# Patient Record
Sex: Male | Born: 1960 | Race: White | Hispanic: No | Marital: Married | Smoking: Never smoker
Health system: Southern US, Community
[De-identification: ages and names within clinical notes are randomized; demographics above are authoritative.]

## PROBLEM LIST (undated history)

## (undated) DIAGNOSIS — Z87442 Personal history of urinary calculi: Secondary | ICD-10-CM

## (undated) DIAGNOSIS — K219 Gastro-esophageal reflux disease without esophagitis: Secondary | ICD-10-CM

## (undated) DIAGNOSIS — N201 Calculus of ureter: Secondary | ICD-10-CM

## (undated) DIAGNOSIS — N393 Stress incontinence (female) (male): Secondary | ICD-10-CM

## (undated) DIAGNOSIS — E785 Hyperlipidemia, unspecified: Secondary | ICD-10-CM

## (undated) DIAGNOSIS — N189 Chronic kidney disease, unspecified: Secondary | ICD-10-CM

## (undated) DIAGNOSIS — C61 Malignant neoplasm of prostate: Secondary | ICD-10-CM

## (undated) DIAGNOSIS — N529 Male erectile dysfunction, unspecified: Secondary | ICD-10-CM

## (undated) HISTORY — PX: WISDOM TOOTH EXTRACTION: SHX21

## (undated) HISTORY — PX: XI ROBOTIC ASSISTED SIMPLE PROSTATECTOMY: SHX6713

## (undated) HISTORY — PX: PROSTATE BIOPSY: SHX241

---

## 1988-02-29 HISTORY — PX: INGUINAL HERNIA REPAIR: SUR1180

## 2012-10-02 ENCOUNTER — Encounter (HOSPITAL_BASED_OUTPATIENT_CLINIC_OR_DEPARTMENT_OTHER): Payer: Self-pay | Admitting: *Deleted

## 2012-10-02 ENCOUNTER — Emergency Department (HOSPITAL_BASED_OUTPATIENT_CLINIC_OR_DEPARTMENT_OTHER): Payer: 59

## 2012-10-02 ENCOUNTER — Emergency Department (HOSPITAL_BASED_OUTPATIENT_CLINIC_OR_DEPARTMENT_OTHER)
Admission: EM | Admit: 2012-10-02 | Discharge: 2012-10-02 | Disposition: A | Discharge status: Home / Self Care | Payer: 59 | Attending: Emergency Medicine | Admitting: Emergency Medicine

## 2012-10-02 DIAGNOSIS — Z862 Personal history of diseases of the blood and blood-forming organs and certain disorders involving the immune mechanism: Secondary | ICD-10-CM | POA: Insufficient documentation

## 2012-10-02 DIAGNOSIS — Z8639 Personal history of other endocrine, nutritional and metabolic disease: Secondary | ICD-10-CM | POA: Insufficient documentation

## 2012-10-02 DIAGNOSIS — N201 Calculus of ureter: Secondary | ICD-10-CM | POA: Insufficient documentation

## 2012-10-02 DIAGNOSIS — K7689 Other specified diseases of liver: Secondary | ICD-10-CM | POA: Insufficient documentation

## 2012-10-02 DIAGNOSIS — K769 Liver disease, unspecified: Secondary | ICD-10-CM

## 2012-10-02 LAB — COMPREHENSIVE METABOLIC PANEL
ALT: 27 U/L (ref 0–53)
AST: 24 U/L (ref 0–37)
Albumin: 4.7 g/dL (ref 3.5–5.2)
CO2: 25 mEq/L (ref 19–32)
Calcium: 10.7 mg/dL — ABNORMAL HIGH (ref 8.4–10.5)
Chloride: 104 mEq/L (ref 96–112)
Creatinine, Ser: 1.6 mg/dL — ABNORMAL HIGH (ref 0.50–1.35)
GFR calc non Af Amer: 47 mL/min — ABNORMAL LOW (ref >90)
Sodium: 142 mEq/L (ref 135–145)
Total Bilirubin: 0.7 mg/dL (ref 0.3–1.2)

## 2012-10-02 LAB — URINALYSIS, ROUTINE W REFLEX MICROSCOPIC
Glucose, UA: NEGATIVE mg/dL
Leukocytes, UA: NEGATIVE
Nitrite: NEGATIVE
pH: 5.5 (ref 5.0–8.0)

## 2012-10-02 LAB — URINE MICROSCOPIC-ADD ON

## 2012-10-02 MED ORDER — HYDROMORPHONE HCL PF 1 MG/ML IJ SOLN
1.0000 mg | Freq: Once | INTRAMUSCULAR | Status: AC
  Administered 2012-10-02: 1 mg via INTRAVENOUS
  Filled 2012-10-02: qty 1

## 2012-10-02 MED ORDER — ONDANSETRON HCL 4 MG/2ML IJ SOLN
4.0000 mg | Freq: Once | INTRAMUSCULAR | Status: AC
  Administered 2012-10-02: 4 mg via INTRAVENOUS
  Filled 2012-10-02: qty 2

## 2012-10-02 MED ORDER — OXYCODONE-ACETAMINOPHEN 10-325 MG PO TABS: 1.0000 | ORAL_TABLET | ORAL | Status: DC | PRN

## 2012-10-02 MED ORDER — HYDROMORPHONE HCL PF 2 MG/ML IJ SOLN
2.0000 mg | Freq: Once | INTRAMUSCULAR | Status: AC
  Administered 2012-10-02: 2 mg via INTRAVENOUS
  Filled 2012-10-02: qty 1

## 2012-10-02 MED ORDER — HYDROMORPHONE HCL PF 1 MG/ML IJ SOLN
INTRAMUSCULAR | Status: AC
  Administered 2012-10-02: 1 mg via INTRAVENOUS
  Filled 2012-10-02: qty 1

## 2012-10-02 MED ORDER — ONDANSETRON 4 MG PO TBDP: 4.0000 mg | ORAL_TABLET | Freq: Three times a day (TID) | ORAL | Status: DC | PRN

## 2012-10-02 MED ORDER — KETOROLAC TROMETHAMINE 30 MG/ML IJ SOLN
30.0000 mg | Freq: Once | INTRAMUSCULAR | Status: AC
  Administered 2012-10-02: 30 mg via INTRAVENOUS
  Filled 2012-10-02: qty 1

## 2012-10-02 MED ORDER — HYDROMORPHONE HCL PF 1 MG/ML IJ SOLN: 1.0000 mg | Freq: Once | INTRAMUSCULAR | Status: AC

## 2012-10-02 NOTE — ED Provider Notes (Signed)
Medical screening examination/treatment/procedure(s) were performed by non-physician practitioner and as supervising physician I was immediately available for consultation/collaboration.  Lyanne Co, MD 10/02/12 [phone]

## 2012-10-02 NOTE — ED Notes (Signed)
Pt c/o pain again, NP made aware.

## 2012-10-02 NOTE — ED Provider Notes (Signed)
CSN: [id number]     Arrival date & time 10/02/12  1842 History     First MD Initiated Contact with Patient 10/02/12 1852     Chief Complaint  Patient presents with  . Flank Pain   (Consider location/radiation/quality/duration/timing/severity/associated sxs/prior Treatment) HPI Comments: Left flank pain times 3 days intermittently  Patient is a 52 y.o. male presenting with flank pain. The history is provided by the patient.  Flank Pain This is a new problem. The current episode started in the past 7 days. The problem has been waxing and waning. Associated symptoms include abdominal pain. Pertinent negatives include no fever. Nothing aggravates the symptoms. He has tried nothing for the symptoms.    Past Medical History  Diagnosis Date  . High triglycerides    Past Surgical History  Procedure Laterality Date  . Hernia repair     No family history on file. History  Substance Use Topics  . Smoking status: Never Smoker   . Smokeless tobacco: Not on file  . Alcohol Use: No    Review of Systems  Constitutional: Negative for fever.  Respiratory: Negative.   Cardiovascular: Negative.   Gastrointestinal: Positive for abdominal pain.  Genitourinary: Positive for flank pain.    Allergies  Review of patient's allergies indicates no known allergies.  Home Medications   Current Outpatient Rx  Name  Route  Sig  Dispense  Refill  . Fenofibrate (TRICOR PO)   Oral   Take by mouth.         . ondansetron (ZOFRAN ODT) 4 MG disintegrating tablet   Oral   Take 1 tablet (4 mg total) by mouth every 8 (eight) hours as needed for nausea.   20 tablet   0   . oxyCODONE-acetaminophen (PERCOCET) 10-325 MG per tablet   Oral   Take 1 tablet by mouth every 4 (four) hours as needed for pain.   30 tablet   0    BP 131/79  Pulse 72  Temp(Src) 97.8 F (36.6 C) (Oral)  Resp 18  Ht 5\' 9"  (1.753 m)  Wt 175 lb (79.379 kg)  BMI 25.83 kg/m2  SpO2 100% Physical Exam  Nursing note and  vitals reviewed. Constitutional: He is oriented to person, place, and time. He appears well-developed and well-nourished.  HENT:  Head: Normocephalic and atraumatic.  Eyes: Conjunctivae are normal. Pupils are equal, round, and reactive to light.  Neck: Normal range of motion. Neck supple.  Cardiovascular: Normal rate and regular rhythm.   Pulmonary/Chest: Effort normal and breath sounds normal.  Abdominal: Soft. Bowel sounds are normal. There is no tenderness.  Musculoskeletal: Normal range of motion.  Neurological: He is alert and oriented to person, place, and time.  Skin:  Yellow in color  Psychiatric: He has a normal mood and affect.    ED Course   Procedures (including critical care time)  Labs Reviewed  URINALYSIS, ROUTINE W REFLEX MICROSCOPIC - Abnormal; Notable for the following:    Color, Urine AMBER (*)    APPearance CLOUDY (*)    Hgb urine dipstick LARGE (*)    Bilirubin Urine SMALL (*)    Protein, ur 30 (*)    All other components within normal limits  COMPREHENSIVE METABOLIC PANEL - Abnormal; Notable for the following:    Glucose, Bld 135 (*)    Creatinine, Ser 1.60 (*)    Calcium 10.7 (*)    GFR calc non Af Amer 47 (*)    GFR calc Af Amer 55 (*)  All other components within normal limits  URINE MICROSCOPIC-ADD ON - Abnormal; Notable for the following:    Squamous Epithelial / LPF FEW (*)    All other components within normal limits   Ct Abdomen Pelvis Wo Contrast  10/02/2012   *RADIOLOGY REPORT*  Clinical Data:  Left-sided flank pain.  CT ABDOMEN AND PELVIS WITHOUT CONTRAST  Technique:  Multidetector CT imaging of the abdomen and pelvis was performed following the standard protocol without intravenous contrast.  Comparison:  No priors.  Findings:  Lung Bases: Scarring or subsegmental atelectasis in the medial segment of the right middle lobe.  Abdomen/Pelvis:  Image 50 of series 5 demonstrates an 8 mm stone in the proximal third of the left ureter at or  immediately beyond the left ureteropelvic junction.  There is moderate left-sided hydronephrosis indicative of obstruction at this time.  No additional calcifications are identified within the collecting system on either kidney, along the course of either ureter, or within the lumen of the urinary bladder.  The unenhanced appearance of the kidneys is otherwise unremarkable bilaterally.  In segment 5 of the liver there is an ill-defined 13 x 10 mm intermediate attenuation (50 HU) lesion which is incompletely characterized on today's noncontrast CT examination.  The appearance of the gallbladder, pancreas, spleen and bilateral adrenal glands is unremarkable.  Normal appendix.  No significant volume of ascites.  No pneumoperitoneum.  No pathologic distension of small bowel.  No definite pathologic lymphadenopathy identified within the abdomen or pelvis on today's noncontrast CT examination. Prostate gland and urinary bladder are unremarkable in appearance.  Musculoskeletal: There are no aggressive appearing lytic or blastic lesions noted in the visualized portions of the skeleton.  IMPRESSION: 1.  8 mm partially obstructing calculus in the proximal third of the left ureter at or immediately beyond the left ureteropelvic junction with moderate left-sided hydronephrosis at this time. 2.  Normal appendix. 3.  13 x 10 mm intermediate attenuation lesion in segment 5 of the liver is incompletely characterized on today's noncontrast CT examination.  Statistically, this is likely to represent a proteinaceous cyst or a hemangioma.  If this is of clinical concern, this could be definitively characterized with a nonemergent hepatic MRI with without IV Gadolinium if indicated.   Original Report Authenticated By: Trudie Reed, M.D.   1. Ureteral stone   2. Liver lesion     MDM  Pt is feeling better at this time:pt to follow up with urology:pt is okay to follow up with pcp for further evaluation of the liver  Teressa Lower, NP 10/02/12 2139

## 2012-10-02 NOTE — ED Notes (Signed)
Left flank pain on and off x 3 days.

## 2012-10-02 NOTE — ED Notes (Signed)
Patient transported to CT 

## 2012-10-04 ENCOUNTER — Other Ambulatory Visit: Payer: Self-pay | Admitting: Family Medicine

## 2012-10-04 DIAGNOSIS — K769 Liver disease, unspecified: Secondary | ICD-10-CM

## 2012-10-05 ENCOUNTER — Encounter (HOSPITAL_COMMUNITY): Payer: Self-pay | Admitting: Pharmacy Technician

## 2012-10-05 ENCOUNTER — Encounter (HOSPITAL_COMMUNITY): Payer: Self-pay | Admitting: General Practice

## 2012-10-05 ENCOUNTER — Other Ambulatory Visit: Payer: Self-pay | Admitting: Urology

## 2012-10-05 NOTE — H&P (Signed)
History of Present Illness   Mr. Kampf has been pain free since I saw him 2 days ago. He looked very good today. I repeated a KUB and the stone may have moved a few centimeters but it is still at L3 spinous process. He has mild stable frequency. He has no fever. Clinically, he is not infected.   Urinalysis: I reviewed, no bacteria. Urine culture from August 6 looked normal.   He would like to proceed with lithotripsy on Monday and I think it is a very good idea. He is a Company secretary.   Review of Systems: No change in bowel or neurologic systems.    Past Medical History Problems  1. History of  Hypercholesterolemia 272.0 2. History of  Hypercholesterolemia 272.0  Surgical History Problems  1. History of  Hernia Repair 2. History of  Hernia Repair  Current Meds 1. Fenofibrate TABS; Therapy: (Recorded:06Aug2014) to 2. Ondansetron HCl 4 MG Oral Tablet; Therapy: (Recorded:06Aug2014) to 3. OxyCODONE HCl TABS; Therapy: (Recorded:06Aug2014) to 4. Terbinafine HCl TABS; Therapy: (Recorded:06Aug2014) to  Allergies Medication  1. No Known Drug Allergies  Family History Problems  1. Family history of  Family Health Status Number Of Children 1 son and 1 daughter 2. Family history of  Heart Disease V17.49 3. Family history of  Hypertension V17.49 4. Paternal history of  Nephrolithiasis 5. Paternal history of  Pharyngeal Neoplasm 6. Paternal history of  Throat  Social History Problems  1. Caffeine Use 2. Marital History - Currently Married 3. 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 History of  Alcohol Use 5. History of  Tobacco Use 305.1  Results/Data Urine [Data Includes: Last 1 Day]   08Aug2014  COLOR YELLOW   APPEARANCE CLEAR   SPECIFIC GRAVITY 1.025   pH 6.0   GLUCOSE NEG mg/dL  BILIRUBIN NEG   KETONE 15 mg/dL  BLOOD LARGE   PROTEIN NEG mg/dL  UROBILINOGEN 0.2 mg/dL  NITRITE NEG   LEUKOCYTE ESTERASE NEG   SQUAMOUS EPITHELIAL/HPF NONE SEEN   WBC 0-2 WBC/hpf  RBC  21-50 RBC/hpf  BACTERIA NONE SEEN   CRYSTALS NONE SEEN   CASTS NONE SEEN    Plan Health Maintenance (V70.0)  1. UA With REFLEX  Done: 08Aug2014 09:24AM  Discussion/Summary   Lithotripsy booked for Monday. No further questions.  After a thorough review of the management options for the patient's condition the patient  elected to proceed with surgical therapy as noted above. We have discussed the potential benefits and risks of the procedure, side effects of the proposed treatment, the likelihood of the patient achieving the goals of the procedure, and any potential problems that might occur during the procedure or recuperation. Informed consent has been obtained.

## 2012-10-08 ENCOUNTER — Ambulatory Visit (HOSPITAL_COMMUNITY): Payer: 59

## 2012-10-08 ENCOUNTER — Encounter (HOSPITAL_COMMUNITY)
Admission: RE | Disposition: A | Discharge status: Home / Self Care | Payer: Self-pay | Source: Ambulatory Visit | Attending: Urology

## 2012-10-08 ENCOUNTER — Encounter (HOSPITAL_COMMUNITY): Payer: Self-pay | Admitting: General Practice

## 2012-10-08 ENCOUNTER — Ambulatory Visit (HOSPITAL_COMMUNITY)
Admission: RE | Admit: 2012-10-08 | Discharge: 2012-10-08 | Disposition: A | Discharge status: Home / Self Care | Payer: 59 | Source: Ambulatory Visit | Attending: Urology | Admitting: Urology

## 2012-10-08 DIAGNOSIS — N201 Calculus of ureter: Secondary | ICD-10-CM | POA: Insufficient documentation

## 2012-10-08 DIAGNOSIS — N1330 Unspecified hydronephrosis: Secondary | ICD-10-CM | POA: Insufficient documentation

## 2012-10-08 HISTORY — PX: EXTRACORPOREAL SHOCK WAVE LITHOTRIPSY: SHX1557

## 2012-10-08 SURGERY — LITHOTRIPSY, ESWL
Anesthesia: LOCAL | Laterality: Left

## 2012-10-08 MED ORDER — DIAZEPAM 5 MG PO TABS
10.0000 mg | ORAL_TABLET | ORAL | Status: AC
  Administered 2012-10-08: 10 mg via ORAL
  Filled 2012-10-08: qty 2

## 2012-10-08 MED ORDER — DEXTROSE-NACL 5-0.45 % IV SOLN
INTRAVENOUS | Status: DC
  Administered 2012-10-08: 15:00:00 via INTRAVENOUS

## 2012-10-08 MED ORDER — DIPHENHYDRAMINE HCL 25 MG PO CAPS
25.0000 mg | ORAL_CAPSULE | ORAL | Status: AC
  Administered 2012-10-08: 25 mg via ORAL
  Filled 2012-10-08: qty 1

## 2012-10-08 MED ORDER — CIPROFLOXACIN HCL 500 MG PO TABS
500.0000 mg | ORAL_TABLET | ORAL | Status: AC
  Administered 2012-10-08: 500 mg via ORAL
  Filled 2012-10-08: qty 1

## 2012-10-08 NOTE — Interval H&P Note (Signed)
History and Physical Interval Note:  10/08/2012 7:22 AM  Johnny Burns  has presented today for surgery, with the diagnosis of left ureteral stone   The various methods of treatment have been discussed with the patient and family. After consideration of risks, benefits and other options for treatment, the patient has consented to  Procedure(s): EXTRACORPOREAL SHOCK WAVE LITHOTRIPSY (ESWL) (Left) as a surgical intervention .  The patient's history has been reviewed, patient examined, no change in status, stable for surgery.  I have reviewed the patient's chart and labs.  Questions were answered to the patient's satisfaction.     Elya Diloreto A

## 2012-10-09 ENCOUNTER — Emergency Department (HOSPITAL_COMMUNITY): Payer: 59

## 2012-10-09 ENCOUNTER — Encounter (HOSPITAL_COMMUNITY): Payer: Self-pay

## 2012-10-09 ENCOUNTER — Emergency Department (HOSPITAL_COMMUNITY)
Admission: EM | Admit: 2012-10-09 | Discharge: 2012-10-09 | Disposition: A | Discharge status: Home / Self Care | Payer: 59 | Attending: Emergency Medicine | Admitting: Emergency Medicine

## 2012-10-09 DIAGNOSIS — Z8639 Personal history of other endocrine, nutritional and metabolic disease: Secondary | ICD-10-CM | POA: Insufficient documentation

## 2012-10-09 DIAGNOSIS — Z79899 Other long term (current) drug therapy: Secondary | ICD-10-CM | POA: Insufficient documentation

## 2012-10-09 DIAGNOSIS — Z792 Long term (current) use of antibiotics: Secondary | ICD-10-CM | POA: Insufficient documentation

## 2012-10-09 DIAGNOSIS — Z862 Personal history of diseases of the blood and blood-forming organs and certain disorders involving the immune mechanism: Secondary | ICD-10-CM | POA: Insufficient documentation

## 2012-10-09 DIAGNOSIS — M549 Dorsalgia, unspecified: Secondary | ICD-10-CM | POA: Insufficient documentation

## 2012-10-09 DIAGNOSIS — Z9889 Other specified postprocedural states: Secondary | ICD-10-CM | POA: Insufficient documentation

## 2012-10-09 DIAGNOSIS — R110 Nausea: Secondary | ICD-10-CM | POA: Insufficient documentation

## 2012-10-09 DIAGNOSIS — N200 Calculus of kidney: Secondary | ICD-10-CM | POA: Insufficient documentation

## 2012-10-09 LAB — URINALYSIS, ROUTINE W REFLEX MICROSCOPIC
Protein, ur: NEGATIVE mg/dL
Specific Gravity, Urine: 1.023 (ref 1.005–1.030)
Urobilinogen, UA: 0.2 mg/dL (ref 0.0–1.0)

## 2012-10-09 LAB — POCT I-STAT, CHEM 8
BUN: 9 mg/dL (ref 6–23)
Creatinine, Ser: 1.6 mg/dL — ABNORMAL HIGH (ref 0.50–1.35)
Hemoglobin: 16.3 g/dL (ref 13.0–17.0)
Potassium: 3.6 mEq/L (ref 3.5–5.1)
Sodium: 136 mEq/L (ref 135–145)
TCO2: 24 mmol/L (ref 0–100)

## 2012-10-09 LAB — URINE MICROSCOPIC-ADD ON

## 2012-10-09 MED ORDER — HYDROMORPHONE HCL PF 1 MG/ML IJ SOLN
1.0000 mg | Freq: Once | INTRAMUSCULAR | Status: AC
  Administered 2012-10-09: 1 mg via INTRAVENOUS
  Filled 2012-10-09: qty 1

## 2012-10-09 MED ORDER — ONDANSETRON HCL 4 MG/2ML IJ SOLN
4.0000 mg | Freq: Once | INTRAMUSCULAR | Status: AC
  Administered 2012-10-09: 4 mg via INTRAVENOUS
  Filled 2012-10-09: qty 2

## 2012-10-09 MED ORDER — SODIUM CHLORIDE 0.9 % IV SOLN
Freq: Once | INTRAVENOUS | Status: AC
  Administered 2012-10-09: 04:00:00 via INTRAVENOUS

## 2012-10-09 MED ORDER — HYDROMORPHONE HCL 4 MG PO TABS: 4.0000 mg | ORAL_TABLET | ORAL | Status: DC | PRN

## 2012-10-09 NOTE — ED Provider Notes (Signed)
CSN: [id number]     Arrival date & time 10/09/12  0346 History     First MD Initiated Contact with Patient 10/09/12 0402     Chief Complaint  Patient presents with  . Flank Pain   (Consider location/radiation/quality/duration/timing/severity/associated sxs/prior Treatment) HPI Comments: Patient presents with left flank pain. He has a history of 8 mm left ureteral stone and had a lithotripsy done yesterday by Dr. Jacquelyne Balint. He states initially the pain was doing okay however tonight he started having worsening pain and is taken several of his 10 mg oxycodone tablets without relief. He primarily has a constant aching pain in his left upper back. He currently denies any abdominal pain. He's had some nausea but no vomiting. He denies any fevers. He is able to urinate.  Patient is a 52 y.o. male presenting with flank pain.  Flank Pain Pertinent negatives include no chest pain, no abdominal pain, no headaches and no shortness of breath.    Past Medical History  Diagnosis Date  . High triglycerides    Past Surgical History  Procedure Laterality Date  . Hernia repair     History reviewed. No pertinent family history. History  Substance Use Topics  . Smoking status: Never Smoker   . Smokeless tobacco: Not on file  . Alcohol Use: No    Review of Systems  Constitutional: Negative for fever, chills, diaphoresis and fatigue.  HENT: Negative for congestion, rhinorrhea and sneezing.   Eyes: Negative.   Respiratory: Negative for cough, chest tightness and shortness of breath.   Cardiovascular: Negative for chest pain and leg swelling.  Gastrointestinal: Positive for nausea. Negative for vomiting, abdominal pain, diarrhea and blood in stool.  Genitourinary: Positive for flank pain. Negative for frequency, hematuria and difficulty urinating.  Musculoskeletal: Positive for back pain. Negative for arthralgias.  Skin: Negative for rash.  Neurological: Negative for dizziness, speech difficulty,  weakness, numbness and headaches.    Allergies  Review of patient's allergies indicates no known allergies.  Home Medications   Current Outpatient Rx  Name  Route  Sig  Dispense  Refill  . ciprofloxacin (CIPRO) 250 MG tablet   Oral   Take 250 mg by mouth 2 (two) times daily.         . fenofibrate 160 MG tablet   Oral   Take 160 mg by mouth every evening.         . ondansetron (ZOFRAN ODT) 4 MG disintegrating tablet   Oral   Take 1 tablet (4 mg total) by mouth every 8 (eight) hours as needed for nausea.   20 tablet   0   . oxyCODONE-acetaminophen (PERCOCET) 10-325 MG per tablet   Oral   Take 1 tablet by mouth every 4 (four) hours as needed for pain.   30 tablet   0   . silodosin (RAPAFLO) 8 MG CAPS capsule   Oral   Take 8 mg by mouth daily with breakfast.         . terbinafine (LAMISIL) 250 MG tablet   Oral   Take 250 mg by mouth daily.         Marland Kitchen HYDROmorphone (DILAUDID) 4 MG tablet   Oral   Take 1 tablet (4 mg total) by mouth every 4 (four) hours as needed for pain.   20 tablet   0    BP 148/84  Pulse 72  Temp(Src) 98.4 F (36.9 C) (Oral)  Resp 18  Ht 5\' 9"  (1.753 m)  Wt 172 lb (  78.019 kg)  BMI 25.39 kg/m2  SpO2 100% Physical Exam  Constitutional: He is oriented to person, place, and time. He appears well-developed and well-nourished.  HENT:  Head: Normocephalic and atraumatic.  Eyes: Pupils are equal, round, and reactive to light.  Neck: Normal range of motion. Neck supple.  Cardiovascular: Normal rate, regular rhythm and normal heart sounds.   Pulmonary/Chest: Effort normal and breath sounds normal. No respiratory distress. He has no wheezes. He has no rales. He exhibits no tenderness.  Abdominal: Soft. Bowel sounds are normal. There is no tenderness. There is no rebound and no guarding.  Left CVA tenderness  Musculoskeletal: Normal range of motion. He exhibits no edema.  Lymphadenopathy:    He has no cervical adenopathy.  Neurological: He  is alert and oriented to person, place, and time.  Skin: Skin is warm and dry. No rash noted.  Psychiatric: He has a normal mood and affect.    ED Course   Procedures (including critical care time)  Results for orders placed during the hospital encounter of 10/09/12  URINALYSIS, ROUTINE W REFLEX MICROSCOPIC      Result Value Range   Color, Urine AMBER (*) YELLOW   APPearance CLOUDY (*) CLEAR   Specific Gravity, Urine 1.023  1.005 - 1.030   pH 5.0  5.0 - 8.0   Glucose, UA NEGATIVE  NEGATIVE mg/dL   Hgb urine dipstick LARGE (*) NEGATIVE   Bilirubin Urine NEGATIVE  NEGATIVE   Ketones, ur NEGATIVE  NEGATIVE mg/dL   Protein, ur NEGATIVE  NEGATIVE mg/dL   Urobilinogen, UA 0.2  0.0 - 1.0 mg/dL   Nitrite NEGATIVE  NEGATIVE   Leukocytes, UA TRACE (*) NEGATIVE  URINE MICROSCOPIC-ADD ON      Result Value Range   Squamous Epithelial / LPF RARE  RARE   WBC, UA 0-2  <3 WBC/hpf   RBC / HPF 11-20  <3 RBC/hpf   Bacteria, UA MANY (*) RARE   Urine-Other MUCOUS PRESENT    POCT I-STAT, CHEM 8      Result Value Range   Sodium 136  135 - 145 mEq/L   Potassium 3.6  3.5 - 5.1 mEq/L   Chloride 101  96 - 112 mEq/L   BUN 9  6 - 23 mg/dL   Creatinine, Ser 8.29 (*) 0.50 - 1.35 mg/dL   Glucose, Bld 562 (*) 70 - 99 mg/dL   Calcium, Ion 1.30 (*) 1.12 - 1.23 mmol/L   TCO2 24  0 - 100 mmol/L   Hemoglobin 16.3  13.0 - 17.0 g/dL   HCT 86.5  78.4 - 69.6 %   Ct Abdomen Pelvis Wo Contrast  10/02/2012   *RADIOLOGY REPORT*  Clinical Data:  Left-sided flank pain.  CT ABDOMEN AND PELVIS WITHOUT CONTRAST  Technique:  Multidetector CT imaging of the abdomen and pelvis was performed following the standard protocol without intravenous contrast.  Comparison:  No priors.  Findings:  Lung Bases: Scarring or subsegmental atelectasis in the medial segment of the right middle lobe.  Abdomen/Pelvis:  Image 50 of series 5 demonstrates an 8 mm stone in the proximal third of the left ureter at or immediately beyond the left  ureteropelvic junction.  There is moderate left-sided hydronephrosis indicative of obstruction at this time.  No additional calcifications are identified within the collecting system on either kidney, along the course of either ureter, or within the lumen of the urinary bladder.  The unenhanced appearance of the kidneys is otherwise unremarkable bilaterally.  In segment  5 of the liver there is an ill-defined 13 x 10 mm intermediate attenuation (50 HU) lesion which is incompletely characterized on today's noncontrast CT examination.  The appearance of the gallbladder, pancreas, spleen and bilateral adrenal glands is unremarkable.  Normal appendix.  No significant volume of ascites.  No pneumoperitoneum.  No pathologic distension of small bowel.  No definite pathologic lymphadenopathy identified within the abdomen or pelvis on today's noncontrast CT examination. Prostate gland and urinary bladder are unremarkable in appearance.  Musculoskeletal: There are no aggressive appearing lytic or blastic lesions noted in the visualized portions of the skeleton.  IMPRESSION: 1.  8 mm partially obstructing calculus in the proximal third of the left ureter at or immediately beyond the left ureteropelvic junction with moderate left-sided hydronephrosis at this time. 2.  Normal appendix. 3.  13 x 10 mm intermediate attenuation lesion in segment 5 of the liver is incompletely characterized on today's noncontrast CT examination.  Statistically, this is likely to represent a proteinaceous cyst or a hemangioma.  If this is of clinical concern, this could be definitively characterized with a nonemergent hepatic MRI with without IV Gadolinium if indicated.   Original Report Authenticated By: Trudie Reed, M.D.   Dg Abd 1 View  10/09/2012   *RADIOLOGY REPORT*  Clinical Data: Flank pain.  ABDOMEN - 1 VIEW  Comparison: 10/08/2012.  Findings: The previously noted calculus projecting in the region of the left ureteropelvic junction  (UPJ) on the prior study is no longer identified in that region on today's examination.  However, there is now a 3 mm calcification in the left hemipelvis which appears to be new compared to prior study, and likely represents a stone fragment which has now migrated into the distal third of the left ureter. Gas and stool are seen scattered throughout the colon extending to the level of the distal rectum.  No pathologic distension of small bowel is noted.  No gross evidence of pneumoperitoneum.  Heterotopic ossification is noted medial to the left femoral neck (unchanged).  IMPRESSION: 1.  3 mm calcification projecting over the expected location of the distal third of the left ureter likely represents a fragment of the previously noted left UPJ stone.   Original Report Authenticated By: Trudie Reed, M.D.   Dg Abd 1 View  10/08/2012   *RADIOLOGY REPORT*  Clinical Data: Pre lithotripsy.  Left ureteral calculus.  ABDOMEN - 1 VIEW  Comparison: CT urogram 10/02/2012.  Findings: Left ureteral calculus is unchanged in position, just above the left L3 spinous process.  This calculus measures 6 mm in diameter.  No other suspicious calcifications are identified.  The bowel gas pattern is normal.  IMPRESSION: Unchanged position of proximal left ureteral calculus.   Original Report Authenticated By: Carey Bullocks, M.D.     1. Kidney stone     MDM  Patient presents with pain status post lithotripsy yesterday. A KUB shows a 3 mm fragment in the distal third of the ureter. His creatinine is stable. His hemoglobin is normal. His pain is fairly well controlled after Dilaudid. I discussed the case with Dr. Margarita Grizzle with [facility] who recommended switching the patient to oral dilaudid and having him followup in the office tomorrow if his symptoms are not controlled.  Rolan Bucco, MD 10/09/12 [phone]

## 2012-10-09 NOTE — ED Notes (Signed)
Pt had lithrotrypsy yesterday afternoon and was felling ok, this evening he started to have pain in the left flank again.

## 2012-10-22 ENCOUNTER — Ambulatory Visit
Admission: RE | Admit: 2012-10-22 | Discharge: 2012-10-22 | Disposition: A | Discharge status: Home / Self Care | Payer: 59 | Source: Ambulatory Visit | Attending: Family Medicine | Admitting: Family Medicine

## 2012-10-22 DIAGNOSIS — K769 Liver disease, unspecified: Secondary | ICD-10-CM

## 2012-10-22 MED ORDER — GADOBENATE DIMEGLUMINE 529 MG/ML IV SOLN
15.0000 mL | Freq: Once | INTRAVENOUS | Status: AC | PRN
  Administered 2012-10-22: 15 mL via INTRAVENOUS

## 2012-11-27 ENCOUNTER — Encounter (HOSPITAL_BASED_OUTPATIENT_CLINIC_OR_DEPARTMENT_OTHER): Payer: Self-pay | Admitting: *Deleted

## 2012-11-27 ENCOUNTER — Other Ambulatory Visit: Payer: Self-pay | Admitting: Urology

## 2012-11-28 ENCOUNTER — Encounter (HOSPITAL_BASED_OUTPATIENT_CLINIC_OR_DEPARTMENT_OTHER): Payer: Self-pay | Admitting: *Deleted

## 2012-11-28 NOTE — Progress Notes (Signed)
NPO AFTER MN. ARRIVE AT 1030. NEEDS ISTAT 8. MAY TAKE DILAUDID AM  OF SURG W/ SIPS OF WATER.IF NEEDED.

## 2012-11-30 ENCOUNTER — Encounter (HOSPITAL_BASED_OUTPATIENT_CLINIC_OR_DEPARTMENT_OTHER): Payer: Self-pay | Admitting: Anesthesiology

## 2012-11-30 ENCOUNTER — Ambulatory Visit (HOSPITAL_BASED_OUTPATIENT_CLINIC_OR_DEPARTMENT_OTHER)
Admission: RE | Admit: 2012-11-30 | Discharge: 2012-11-30 | Disposition: A | Discharge status: Home / Self Care | Payer: 59 | Source: Ambulatory Visit | Attending: Urology | Admitting: Urology

## 2012-11-30 ENCOUNTER — Encounter (HOSPITAL_BASED_OUTPATIENT_CLINIC_OR_DEPARTMENT_OTHER): Payer: Self-pay

## 2012-11-30 ENCOUNTER — Ambulatory Visit (HOSPITAL_COMMUNITY): Payer: 59

## 2012-11-30 ENCOUNTER — Encounter (HOSPITAL_BASED_OUTPATIENT_CLINIC_OR_DEPARTMENT_OTHER)
Admission: RE | Disposition: A | Discharge status: Home / Self Care | Payer: Self-pay | Source: Ambulatory Visit | Attending: Urology

## 2012-11-30 ENCOUNTER — Ambulatory Visit (HOSPITAL_BASED_OUTPATIENT_CLINIC_OR_DEPARTMENT_OTHER): Payer: 59 | Admitting: Anesthesiology

## 2012-11-30 DIAGNOSIS — N1330 Unspecified hydronephrosis: Secondary | ICD-10-CM | POA: Insufficient documentation

## 2012-11-30 DIAGNOSIS — N201 Calculus of ureter: Secondary | ICD-10-CM | POA: Insufficient documentation

## 2012-11-30 HISTORY — PX: CYSTOSCOPY WITH RETROGRADE PYELOGRAM, URETEROSCOPY AND STENT PLACEMENT: SHX5789

## 2012-11-30 HISTORY — DX: Calculus of ureter: N20.1

## 2012-11-30 LAB — POCT I-STAT, CHEM 8
Calcium, Ion: 1 mmol/L — ABNORMAL LOW (ref 1.12–1.23)
Chloride: 108 mEq/L (ref 96–112)
Creatinine, Ser: 1.3 mg/dL (ref 0.50–1.35)
Glucose, Bld: 91 mg/dL (ref 70–99)
HCT: 45 % (ref 39.0–52.0)
Hemoglobin: 15.3 g/dL (ref 13.0–17.0)
Potassium: 5.7 mEq/L — ABNORMAL HIGH (ref 3.5–5.1)
Sodium: 138 mEq/L (ref 135–145)
TCO2: 24 mmol/L (ref 0–100)

## 2012-11-30 SURGERY — CYSTOURETEROSCOPY, WITH RETROGRADE PYELOGRAM AND STENT INSERTION
Anesthesia: General | Site: Ureter | Laterality: Left | Wound class: Clean Contaminated

## 2012-11-30 MED ORDER — OXYCODONE HCL 5 MG/5ML PO SOLN
5.0000 mg | Freq: Once | ORAL | Status: DC | PRN
  Filled 2012-11-30: qty 5

## 2012-11-30 MED ORDER — OXYCODONE HCL 5 MG PO TABS
5.0000 mg | ORAL_TABLET | Freq: Once | ORAL | Status: DC | PRN
  Filled 2012-11-30: qty 1

## 2012-11-30 MED ORDER — SENNOSIDES-DOCUSATE SODIUM 8.6-50 MG PO TABS: 1.0000 | ORAL_TABLET | Freq: Two times a day (BID) | ORAL | Status: DC

## 2012-11-30 MED ORDER — PROMETHAZINE HCL 25 MG/ML IJ SOLN
6.2500 mg | INTRAMUSCULAR | Status: DC | PRN
  Filled 2012-11-30: qty 1

## 2012-11-30 MED ORDER — ONDANSETRON HCL 4 MG/2ML IJ SOLN
INTRAMUSCULAR | Status: DC | PRN
  Administered 2012-11-30: 4 mg via INTRAVENOUS

## 2012-11-30 MED ORDER — MIDAZOLAM HCL 5 MG/5ML IJ SOLN
INTRAMUSCULAR | Status: DC | PRN
  Administered 2012-11-30: 2 mg via INTRAVENOUS

## 2012-11-30 MED ORDER — PROPOFOL 10 MG/ML IV BOLUS
INTRAVENOUS | Status: DC | PRN
  Administered 2012-11-30: 170 mg via INTRAVENOUS

## 2012-11-30 MED ORDER — HYDROMORPHONE HCL PF 1 MG/ML IJ SOLN
0.2500 mg | INTRAMUSCULAR | Status: DC | PRN
  Filled 2012-11-30: qty 1

## 2012-11-30 MED ORDER — GENTAMICIN IN SALINE 1.6-0.9 MG/ML-% IV SOLN
80.0000 mg | INTRAVENOUS | Status: DC
  Filled 2012-11-30: qty 50

## 2012-11-30 MED ORDER — GENTAMICIN SULFATE 40 MG/ML IJ SOLN
5.0000 mg/kg | INTRAVENOUS | Status: DC
  Filled 2012-11-30: qty 9.5

## 2012-11-30 MED ORDER — MEPERIDINE HCL 25 MG/ML IJ SOLN
6.2500 mg | INTRAMUSCULAR | Status: DC | PRN
  Filled 2012-11-30: qty 1

## 2012-11-30 MED ORDER — OXYCODONE-ACETAMINOPHEN 5-325 MG PO TABS: 1.0000 | ORAL_TABLET | ORAL | Status: DC | PRN

## 2012-11-30 MED ORDER — LIDOCAINE HCL (CARDIAC) 20 MG/ML IV SOLN
INTRAVENOUS | Status: DC | PRN
  Administered 2012-11-30: 60 mg via INTRAVENOUS

## 2012-11-30 MED ORDER — SULFAMETHOXAZOLE-TMP DS 800-160 MG PO TABS: 1.0000 | ORAL_TABLET | Freq: Every day | ORAL | Status: DC

## 2012-11-30 MED ORDER — DEXAMETHASONE SODIUM PHOSPHATE 4 MG/ML IJ SOLN
INTRAMUSCULAR | Status: DC | PRN
  Administered 2012-11-30: 8 mg via INTRAVENOUS

## 2012-11-30 MED ORDER — SODIUM CHLORIDE 0.9 % IR SOLN
Status: DC | PRN
  Administered 2012-11-30: 6000 mL

## 2012-11-30 MED ORDER — LACTATED RINGERS IV SOLN
INTRAVENOUS | Status: DC
  Administered 2012-11-30 (×2): via INTRAVENOUS
  Filled 2012-11-30: qty 1000

## 2012-11-30 MED ORDER — KETOROLAC TROMETHAMINE 30 MG/ML IJ SOLN
INTRAMUSCULAR | Status: DC | PRN
  Administered 2012-11-30: 30 mg via INTRAVENOUS

## 2012-11-30 MED ORDER — IOHEXOL 350 MG/ML SOLN
INTRAVENOUS | Status: DC | PRN
  Administered 2012-11-30: 20 mL

## 2012-11-30 MED ORDER — FENTANYL CITRATE 0.05 MG/ML IJ SOLN
INTRAMUSCULAR | Status: DC | PRN
  Administered 2012-11-30 (×2): 25 ug via INTRAVENOUS
  Administered 2012-11-30: 50 ug via INTRAVENOUS

## 2012-11-30 SURGICAL SUPPLY — 38 items
BAG DRAIN URO-CYSTO SKYTR STRL (DRAIN) ×3 IMPLANT
BASKET LASER NITINOL 1.9FR (BASKET) ×3 IMPLANT
BASKET STNLS GEMINI 4WIRE 3FR (BASKET) IMPLANT
BASKET ZERO TIP NITINOL 2.4FR (BASKET) IMPLANT
BRUSH URET BIOPSY 3F (UROLOGICAL SUPPLIES) IMPLANT
CANISTER SUCT LVC 12 LTR MEDI- (MISCELLANEOUS) ×3 IMPLANT
CATH COUDE FOLEY 2W 5CC 18FR (CATHETERS) ×3 IMPLANT
CATH INTERMIT  6FR 70CM (CATHETERS) ×3 IMPLANT
CATH URET 5FR 28IN CONE TIP (BALLOONS)
CATH URET 5FR 28IN OPEN ENDED (CATHETERS) IMPLANT
CATH URET 5FR 70CM CONE TIP (BALLOONS) IMPLANT
CLOTH BEACON ORANGE TIMEOUT ST (SAFETY) ×3 IMPLANT
DRAPE CAMERA CLOSED 9X96 (DRAPES) ×3 IMPLANT
ELECT REM PT RETURN 9FT ADLT (ELECTROSURGICAL)
ELECTRODE REM PT RTRN 9FT ADLT (ELECTROSURGICAL) IMPLANT
GLOVE BIO SURGEON STRL SZ7.5 (GLOVE) ×3 IMPLANT
GLOVE BIOGEL M STER SZ 6 (GLOVE) ×3 IMPLANT
GLOVE BIOGEL PI IND STRL 6.5 (GLOVE) ×2 IMPLANT
GLOVE BIOGEL PI INDICATOR 6.5 (GLOVE) ×1
GOWN PREVENTION PLUS LG XLONG (DISPOSABLE) ×6 IMPLANT
GOWN STRL REIN XL XLG (GOWN DISPOSABLE) IMPLANT
GUIDEWIRE 0.038 PTFE COATED (WIRE) IMPLANT
GUIDEWIRE ANG ZIPWIRE 038X150 (WIRE) ×3 IMPLANT
GUIDEWIRE STR DUAL SENSOR (WIRE) ×3 IMPLANT
IV NS IRRIG 3000ML ARTHROMATIC (IV SOLUTION) ×9 IMPLANT
KIT BALLIN UROMAX 15FX10 (LABEL) IMPLANT
KIT BALLN UROMAX 15FX4 (MISCELLANEOUS) IMPLANT
KIT BALLN UROMAX 26 75X4 (MISCELLANEOUS)
LASER FIBER DISP (UROLOGICAL SUPPLIES) IMPLANT
PACK CYSTOSCOPY (CUSTOM PROCEDURE TRAY) ×3 IMPLANT
SET HIGH PRES BAL DIL (LABEL)
SHEATH ACCESS URETERAL 38CM (SHEATH) ×3 IMPLANT
SHEATH URET ACCESS 12FR/35CM (UROLOGICAL SUPPLIES) IMPLANT
SHEATH URET ACCESS 12FR/55CM (UROLOGICAL SUPPLIES) IMPLANT
STENT URET 6FRX26 CONTOUR (STENTS) ×3 IMPLANT
SYRINGE 10CC LL (SYRINGE) ×3 IMPLANT
SYRINGE IRR TOOMEY STRL 70CC (SYRINGE) IMPLANT
TUBE FEEDING 8FR 16IN STR KANG (MISCELLANEOUS) ×3 IMPLANT

## 2012-11-30 NOTE — Brief Op Note (Signed)
11/30/2012  12:21 PM  PATIENT:  Javier Glazier  52 y.o. male  PRE-OPERATIVE DIAGNOSIS:  LEFT URETERAL STONE  POST-OPERATIVE DIAGNOSIS:  LEFT URETERAL STONE  PROCEDURE:  Procedure(s): CYSTOSCOPY WITH LEFT RETROGRADE PYELOGRAM, LEFT URETEROSCOPY LEFT STONE EXTRACTION WITH BASKET  AND LEFT STENT PLACEMENT (Left)  SURGEON:  Surgeon(s) and Role:    * Sebastian Ache, MD - Primary  PHYSICIAN ASSISTANT:   ASSISTANTS: none   ANESTHESIA:   general  EBL:  Total I/O In: 200 [I.V.:200] Out: -   BLOOD ADMINISTERED:none  DRAINS: none   LOCAL MEDICATIONS USED:  NONE  SPECIMEN:  Source of Specimen:  Left Distal ureteral stone  DISPOSITION OF SPECIMEN:  [facility] for compositional analysis  COUNTS:  YES  TOURNIQUET:  * No tourniquets in log *  DICTATION: .Other Dictation: Dictation Number [id number]  PLAN OF CARE: Discharge to home after PACU  PATIENT DISPOSITION:  PACU - hemodynamically stable.   Delay start of Pharmacological VTE agent (>24hrs) due to surgical blood loss or risk of bleeding: not applicable

## 2012-11-30 NOTE — OR Nursing (Signed)
Left stone taking by Dr. Berneice Heinrich.

## 2012-11-30 NOTE — Anesthesia Preprocedure Evaluation (Addendum)
Anesthesia Evaluation  Patient identified by MRN, date of birth, ID band Patient awake    Reviewed: Allergy & Precautions, H&P , NPO status , Patient's Chart, lab work & pertinent test results  Airway Mallampati: II TM Distance: >3 FB Neck ROM: Full    Dental  (+) Teeth Intact and Dental Advisory Given   Pulmonary neg pulmonary ROS,  breath sounds clear to auscultation        Cardiovascular negative cardio ROS  Rhythm:Regular Rate:Normal     Neuro/Psych negative neurological ROS  negative psych ROS   GI/Hepatic negative GI ROS, Neg liver ROS,   Endo/Other  negative endocrine ROS  Renal/GU negative Renal ROS     Musculoskeletal negative musculoskeletal ROS (+)   Abdominal   Peds  Hematology negative hematology ROS (+)   Anesthesia Other Findings   Reproductive/Obstetrics                           Anesthesia Physical Anesthesia Plan  ASA: I  Anesthesia Plan: General   Post-op Pain Management:    Induction: Intravenous  Airway Management Planned: LMA  Additional Equipment:   Intra-op Plan:   Post-operative Plan: Extubation in OR  Informed Consent: I have reviewed the patients History and Physical, chart, labs and discussed the procedure including the risks, benefits and alternatives for the proposed anesthesia with the patient or authorized representative who has indicated his/her understanding and acceptance.   Dental advisory given  Plan Discussed with: CRNA  Anesthesia Plan Comments:         Anesthesia Quick Evaluation

## 2012-11-30 NOTE — Transfer of Care (Signed)
Immediate Anesthesia Transfer of Care Note  Patient: Johnny Burns  Procedure(s) Performed: Procedure(s) (LRB): CYSTOSCOPY WITH LEFT RETROGRADE PYELOGRAM, LEFT URETEROSCOPY LEFT STONE EXTRACTION WITH BASKET  AND LEFT STENT PLACEMENT (Left)  Patient Location: PACU  Anesthesia Type: General  Level of Consciousness: awake, oriented, sedated and patient cooperative  Airway & Oxygen Therapy: Patient Spontanous Breathing and Patient connected to face mask oxygen  Post-op Assessment: Report given to PACU RN and Post -op Vital signs reviewed and stable  Post vital signs: Reviewed and stable  Complications: No apparent anesthesia complications

## 2012-11-30 NOTE — Consult Note (Signed)
NAME:  Johnny Burns, Johnny Burns           ACCOUNT NO.:  [id number]  MEDICAL RECORD NO.:  [id number]  LOCATION:  WA10                         FACILITY:  [facility]  PHYSICIAN:  Treasa Bradshaw, MD     DATE OF BIRTH:  03/26/1960  DATE OF SURGERY: 11/30/2012  DIAGNOSIS:  Retained left distal ureteral stone.  PROCEDURE: 1. Cystoscopy with left retrograde pyelogram and interpretation. 2. Left ureteroscopy with basketing of stone. 3. Insertion of left ureteral stent 6 x 26 with tether to the dorsum     of the penis.  COMPLICATIONS:  None.  SPECIMEN:  Left ureteral stone for compositional analysis.  FINDINGS: 1. Unremarkable urinary bladder. 2. Distal filling defect on retrograde pyelogram with moderate     hydroureteronephrosis consistent with known stone. 3. Distal fragment on the left ureter consistent with known stone.  No     additional fragments above in the ureter or the kidney.  ESTIMATED BLOOD LOSS:  Nil.  DRAINS:  None.  INDICATION:  Johnny Burns is a pleasant 52-year-old gentleman with recent history of left renal colic from ureteral stone.  He underwent shockwave lithotripsy  and passed some stone fragments, however, he had residual mild flank pain and KUB imaging revealed a retained distal ureteral fragment.  He underwent a month of medical expulsive therapy with alpha blockers and pain control and still failed to pass the fragments.  Options were discussed including continued medical therapy versus repeat shockwave lithotripsy versus ureteroscopy and wished to proceed with the latter.  Informed consent was obtained and placed in medical record.  PROCEDURE IN DETAIL:  The patient being Johnny Burns, was verified. Procedure being left ureteroscopic stone manipulation was confirmed. Procedure was carried out.  Time-out was performed.  Intravenous antibiotics administered.  General LMA anesthesia was introduced.  The patient was placed into a low lithotomy position.  Sterile  field was created by prepping and draping the patient's penis, perineum, proximal thighs using iodine x3.  Next, cystourethroscopy was performed using a 22-French rigid scope with 12-degree offset lens.  Inspection of the anterior and posterior urethra unremarkable.  Inspection of the urinary bladder revealed no diverticula, calcifications, papular lesions.  The left ureteral orifice was then cannulated with a 6-French end-hole catheter and left retrograde pyelogram was obtained.  Left retrograde pyelogram demonstrated a single left ureter with single system left kidney.  There was a filling defect in distal ureter consistent with known stone.  There was moderate hydroureteronephrosis above this.  A 0.038 Glidewire was advanced at the level of the left upper pole and set aside as a safety wire.  Next, semi-rigid ureteroscopy was performed at the distal two-thirds of the left ureter alongside a separate Sensor working wire and with an 8-French feeding tube in urinary bladder for pressure release.  The fragment in question was indeed found in the distal left ureter.  This appeared to be amenable to simple basketing.  As such, a escape basket was used to grasp the stone and was removed in its entirety and set aside for compositional analysis as the goal for today was to verify stone free status.  The semi-rigid ureteroscope was exchanged for the flexible 6- French nondigital ureteroscope.  An attempt was made to pass this over the sensor working wire, however, it did not easily traverse the intramural ureter.  As such, a 12/14 38-cm   ureteral access sheath was placed over the working wire, which was easily navigated under continuous fluoroscopic vision to level of the proximal ureter.  Next, flexible ureteroscopy was performed of the proximal ureter and entire left kidney.  It was systematically inspected at the level of each calix and no additional stone fragments were found.  The entire  length of ureter was once again visualized using flexible ureteroscopy on withdrawal of the access sheath.  No mucosal abnormalities were found. Finally, a new 6 x 26 Polaris type stent was placed using cystoscopic and fluoroscopic guidance.  Good proximal and distal deployment were noted.  Tether was left in situ, fashioned the dorsum of the penis. Bladder was emptied per cystoscope.  Procedure was then terminated.  The patient tolerated the procedure well.  There were no immediate periprocedural complications.  The patient was taken to postanesthesia care unit in stable condition.          ______________________________ Johnny [name], MD     TM/MEDQ  D:  11/30/2012  T:  11/30/2012  Job:  [id number] 

## 2012-11-30 NOTE — H&P (Signed)
Johnny Burns is an 52 y.o. male.    Chief Complaint:  Pre-Op Left Ureteroscopic Stone Manipulation  HPI:   1 - Left Ureteral Stone - Pt s/p shockwave lithotripsy 10/08/12 for left 8mm ureteral stone (700HU, SSD 14cm) found on w/ ufor colickly left flank pain. No additional stones. No prior episdoes. Persited radiographically on f/u KUB likely as several left distal fragments and has not passed much material. Pain controlled, only occasional mild colic.  2 - Prostate Screening - no FHX prostate cancer 2014 - up to date per PCP with DRE and PSA per pateint  PMH sig for onchomycosis, right inguinal hernia repair  Today Johnny Burns is seen ito proceed with left ureteroscopic stone manipulation for his residual distal ureteral stone after lithotripsy. No interval fevers or stone passage.   Past Medical History  Diagnosis Date  . Left ureteral calculus     Past Surgical History  Procedure Laterality Date  . Extracorporeal shock wave lithotripsy Left 10-08-2012  . Inguinal hernia repair Right 1990    No family history on file. Social History:  reports that he has never smoked. He has never used smokeless tobacco. He reports that he does not drink alcohol or use illicit drugs.  Allergies: No Known Allergies  No prescriptions prior to admission    No results found for this or any previous visit (from the past 48 hour(s)). No results found.  Review of Systems  Constitutional: Negative.  Negative for fever and chills.  HENT: Negative.   Eyes: Negative.   Respiratory: Negative.   Cardiovascular: Negative.   Gastrointestinal: Negative.   Genitourinary: Negative.   Musculoskeletal: Negative.   Skin: Negative.   Neurological: Negative.   Endo/Heme/Allergies: Negative.   Psychiatric/Behavioral: Negative.     Height 5\' 9"  (1.753 m), weight 78.019 kg (172 lb). Physical Exam  Constitutional: He is oriented to person, place, and time. He appears well-developed and well-nourished.   HENT:  Head: Normocephalic and atraumatic.  Eyes: EOM are normal. Pupils are equal, round, and reactive to light.  Neck: Normal range of motion. Neck supple.  Cardiovascular: Normal rate.   Respiratory: Effort normal.  GI: Soft. Bowel sounds are normal.  Genitourinary: Penis normal.  Musculoskeletal: Normal range of motion.  Neurological: He is alert and oriented to person, place, and time.  Skin: Skin is warm and dry.  Psychiatric: He has a normal mood and affect. His behavior is normal. Judgment and thought content normal.     Assessment/Plan  1 - Left Ureteral Stone -  We rediscussed ureteroscopic stone manipulation with basketing and laser-lithotripsy in detail.  We ediscussed risks including bleeding, infection, damage to kidney / ureter  bladder, rarely loss of kidney. We rediscussed anesthetic risks and rare but serious surgical complications including DVT, PE, MI, and mortality. We specifically readdressed that in 5-10% of cases a staged approach is required with stenting followed by re-attempt ureteroscopy if anatomy unfavorable.   The patient voiced understanding and wises to proceed today as planned.   2 - Prostate Screening - up to date this year  [facility], Chimene Salo 11/30/2012, 6:29 AM

## 2012-11-30 NOTE — Anesthesia Postprocedure Evaluation (Signed)
Anesthesia Post Note  Patient: Johnny Burns  Procedure(s) Performed: Procedure(s) (LRB): CYSTOSCOPY WITH LEFT RETROGRADE PYELOGRAM, LEFT URETEROSCOPY LEFT STONE EXTRACTION WITH BASKET  AND LEFT STENT PLACEMENT (Left)  Anesthesia type: General  Patient location: PACU  Post pain: Pain level controlled  Post assessment: Post-op Vital signs reviewed  Last Vitals: BP 108/70  Pulse 73  Temp(Src) 37.1 C (Oral)  Resp 14  Ht 5\' 9"  (1.753 m)  Wt 167 lb (75.751 kg)  BMI 24.65 kg/m2  SpO2 97%  Post vital signs: Reviewed  Level of consciousness: sedated  Complications: No apparent anesthesia complications

## 2012-11-30 NOTE — Anesthesia Procedure Notes (Signed)
Procedure Name: LMA Insertion Date/Time: 11/30/2012 11:47 AM Performed by: Renella Cunas D Pre-anesthesia Checklist: Patient identified, Emergency Drugs available, Suction available and Patient being monitored Patient Re-evaluated:Patient Re-evaluated prior to inductionOxygen Delivery Method: Circle System Utilized Preoxygenation: Pre-oxygenation with 100% oxygen Intubation Type: IV induction Ventilation: Mask ventilation without difficulty LMA: LMA inserted LMA Size: 4.0 Number of attempts: 1 Airway Equipment and Method: bite block Placement Confirmation: positive ETCO2 Tube secured with: Tape Dental Injury: Teeth and Oropharynx as per pre-operative assessment

## 2012-11-30 NOTE — Consult Note (Deleted)
NAMEMarland Kitchen  Johnny Burns, Johnny Burns NO.:  [id number]  MEDICAL RECORD NO.:  [id number]  LOCATION:  WA10                         FACILITY:  [facility]  PHYSICIAN:  Sebastian Ache, MD     DATE OF BIRTH:  [birth year]  DATE OF SURGERY: 11/30/2012  DIAGNOSIS:  Retained left distal ureteral stone.  PROCEDURE: 1. Cystoscopy with left retrograde pyelogram and interpretation. 2. Left ureteroscopy with basketing of stone. 3. Insertion of left ureteral stent 6 x 26 with tether to the dorsum     of the penis.  COMPLICATIONS:  None.  SPECIMEN:  Left ureteral stone for compositional analysis.  FINDINGS: 1. Unremarkable urinary bladder. 2. Distal filling defect on retrograde pyelogram with moderate     hydroureteronephrosis consistent with known stone. 3. Distal fragment on the left ureter consistent with known stone.  No     additional fragments above in the ureter or the kidney.  ESTIMATED BLOOD LOSS:  Nil.  DRAINS:  None.  INDICATION:  Johnny Burns is a pleasant 52 year old gentleman with recent history of left renal colic from ureteral stone.  He underwent shockwave lithotripsy  and passed some stone fragments, however, he had residual mild flank pain and KUB imaging revealed a retained distal ureteral fragment.  He underwent a month of medical expulsive therapy with alpha blockers and pain control and still failed to pass the fragments.  Options were discussed including continued medical therapy versus repeat shockwave lithotripsy versus ureteroscopy and wished to proceed with the latter.  Informed consent was obtained and placed in medical record.  PROCEDURE IN DETAIL:  The patient being Johnny Burns, was verified. Procedure being left ureteroscopic stone manipulation was confirmed. Procedure was carried out.  Time-out was performed.  Intravenous antibiotics administered.  General LMA anesthesia was introduced.  The patient was placed into a low lithotomy position.  Sterile  field was created by prepping and draping the patient's penis, perineum, proximal thighs using iodine x3.  Next, cystourethroscopy was performed using a 22-French rigid scope with 12-degree offset lens.  Inspection of the anterior and posterior urethra unremarkable.  Inspection of the urinary bladder revealed no diverticula, calcifications, papular lesions.  The left ureteral orifice was then cannulated with a 6-French end-hole catheter and left retrograde pyelogram was obtained.  Left retrograde pyelogram demonstrated a single left ureter with single system left kidney.  There was a filling defect in distal ureter consistent with known stone.  There was moderate hydroureteronephrosis above this.  A 0.038 Glidewire was advanced at the level of the left upper pole and set aside as a safety wire.  Next, semi-rigid ureteroscopy was performed at the distal two-thirds of the left ureter alongside a separate Sensor working wire and with an 8-French feeding tube in urinary bladder for pressure release.  The fragment in question was indeed found in the distal left ureter.  This appeared to be amenable to simple basketing.  As such, a escape basket was used to grasp the stone and was removed in its entirety and set aside for compositional analysis as the goal for today was to verify stone free status.  The semi-rigid ureteroscope was exchanged for the flexible 6- French nondigital ureteroscope.  An attempt was made to pass this over the sensor working wire, however, it did not easily traverse the intramural ureter.  As such, a 12/14 38-cm  ureteral access sheath was placed over the working wire, which was easily navigated under continuous fluoroscopic vision to level of the proximal ureter.  Next, flexible ureteroscopy was performed of the proximal ureter and entire left kidney.  It was systematically inspected at the level of each calix and no additional stone fragments were found.  The entire  length of ureter was once again visualized using flexible ureteroscopy on withdrawal of the access sheath.  No mucosal abnormalities were found. Finally, a new 6 x 26 Polaris type stent was placed using cystoscopic and fluoroscopic guidance.  Good proximal and distal deployment were noted.  Tether was left in situ, fashioned the dorsum of the penis. Bladder was emptied per cystoscope.  Procedure was then terminated.  The patient tolerated the procedure well.  There were no immediate periprocedural complications.  The patient was taken to postanesthesia care unit in stable condition.          ______________________________ Sebastian Ache, MD     TM/MEDQ  D:  11/30/2012  T:  11/30/2012  Job:  [id number]

## 2012-12-03 ENCOUNTER — Encounter (HOSPITAL_BASED_OUTPATIENT_CLINIC_OR_DEPARTMENT_OTHER): Payer: Self-pay | Admitting: Urology

## 2014-10-22 ENCOUNTER — Ambulatory Visit
Admission: RE | Admit: 2014-10-22 | Discharge: 2014-10-22 | Disposition: A | Discharge status: Home / Self Care | Payer: No Typology Code available for payment source | Source: Ambulatory Visit | Attending: Occupational Medicine | Admitting: Occupational Medicine

## 2014-10-22 ENCOUNTER — Other Ambulatory Visit: Payer: Self-pay | Admitting: Occupational Medicine

## 2014-10-22 DIAGNOSIS — Z139 Encounter for screening, unspecified: Secondary | ICD-10-CM

## 2016-05-09 DIAGNOSIS — E785 Hyperlipidemia, unspecified: Secondary | ICD-10-CM | POA: Diagnosis not present

## 2016-05-09 DIAGNOSIS — Z0000 Encounter for general adult medical examination without abnormal findings: Secondary | ICD-10-CM | POA: Diagnosis not present

## 2016-07-28 DIAGNOSIS — R972 Elevated prostate specific antigen [PSA]: Secondary | ICD-10-CM | POA: Diagnosis not present

## 2016-07-28 DIAGNOSIS — N200 Calculus of kidney: Secondary | ICD-10-CM | POA: Diagnosis not present

## 2016-07-28 DIAGNOSIS — N183 Chronic kidney disease, stage 3 (moderate): Secondary | ICD-10-CM | POA: Diagnosis not present

## 2016-10-06 DIAGNOSIS — R972 Elevated prostate specific antigen [PSA]: Secondary | ICD-10-CM | POA: Diagnosis not present

## 2016-10-10 ENCOUNTER — Other Ambulatory Visit: Payer: Self-pay | Admitting: Urology

## 2016-10-10 DIAGNOSIS — C61 Malignant neoplasm of prostate: Secondary | ICD-10-CM

## 2016-10-14 DIAGNOSIS — C61 Malignant neoplasm of prostate: Secondary | ICD-10-CM | POA: Diagnosis not present

## 2016-10-18 ENCOUNTER — Ambulatory Visit (HOSPITAL_COMMUNITY)
Admission: RE | Admit: 2016-10-18 | Discharge: 2016-10-18 | Disposition: A | Discharge status: Home / Self Care | Payer: 59 | Source: Ambulatory Visit | Attending: Urology | Admitting: Urology

## 2016-10-18 ENCOUNTER — Encounter (HOSPITAL_COMMUNITY)
Admission: RE | Admit: 2016-10-18 | Discharge: 2016-10-18 | Disposition: A | Discharge status: Home / Self Care | Payer: 59 | Source: Ambulatory Visit | Attending: Urology | Admitting: Urology

## 2016-10-18 DIAGNOSIS — C61 Malignant neoplasm of prostate: Secondary | ICD-10-CM

## 2016-10-18 DIAGNOSIS — K8020 Calculus of gallbladder without cholecystitis without obstruction: Secondary | ICD-10-CM | POA: Diagnosis not present

## 2016-10-18 MED ORDER — TECHNETIUM TC 99M MEDRONATE IV KIT
20.9000 | PACK | Freq: Once | INTRAVENOUS | Status: AC | PRN
  Administered 2016-10-18: 20.9 via INTRAVENOUS

## 2016-10-24 DIAGNOSIS — C61 Malignant neoplasm of prostate: Secondary | ICD-10-CM | POA: Diagnosis not present

## 2016-10-24 DIAGNOSIS — N200 Calculus of kidney: Secondary | ICD-10-CM | POA: Diagnosis not present

## 2016-10-25 ENCOUNTER — Other Ambulatory Visit: Payer: Self-pay | Admitting: Urology

## 2016-11-03 DIAGNOSIS — M6281 Muscle weakness (generalized): Secondary | ICD-10-CM | POA: Diagnosis not present

## 2016-11-03 DIAGNOSIS — R278 Other lack of coordination: Secondary | ICD-10-CM | POA: Diagnosis not present

## 2016-11-03 DIAGNOSIS — C61 Malignant neoplasm of prostate: Secondary | ICD-10-CM | POA: Diagnosis not present

## 2016-11-15 DIAGNOSIS — M6281 Muscle weakness (generalized): Secondary | ICD-10-CM | POA: Diagnosis not present

## 2016-11-15 DIAGNOSIS — M62838 Other muscle spasm: Secondary | ICD-10-CM | POA: Diagnosis not present

## 2016-11-18 ENCOUNTER — Encounter (HOSPITAL_COMMUNITY): Payer: Self-pay

## 2016-11-18 NOTE — Patient Instructions (Addendum)
Johnny Burns  11/18/2016   Your procedure is scheduled on: 11/30/16  Report to [facility] Main  Entrance Take [place]  elevators to 3rd floor to  [place] at    1100AM.     Call this number if you have problems the morning of surgery [phone]    Remember: ONLY 1 PERSON MAY GO WITH YOU TO SHORT STAY TO GET  READY MORNING OF YOUR SURGERY.             FOLLOW A CLEAR LIQUID DIET THE DAY BEFORE SURGERY AND ONE BOTTLE OF MAGNESIUM CIT[place] BY NOON THE DAY BEFORE SURGERY   Do not  drink liquids : after . 0700 am the day of your surgery     Take these medicines the morning of surgery with A SIP OF WATER: NONE                                You may not have any metal on your body including hair pins and              piercings  Do not wear jewelry,lotions, powders or perfumes, deodorant                      Men may shave face and neck.   Do not bring valuables to the hospital. [place].  Contacts, dentures or bridgework may not be worn into surgery.  Leave suitcase in the car. After surgery it may be brought to your room.               Please read over the following fact sheets you were given: _____________________________________________________________________          [facility] - Preparing for Surgery Before surgery, you can play an important role.  Because skin is not sterile, your skin needs to be as free of germs as possible.  You can reduce the number of germs on your skin by washing with CHG (chlorahexidine gluconate) soap before surgery.  CHG is an antiseptic cleaner which kills germs and bonds with the skin to continue killing germs even after washing. Please DO NOT use if you have an allergy to CHG or antibacterial soaps.  If your skin becomes reddened/irritated stop using the CHG and inform your nurse when you arrive at Short Stay. Do not shave (including legs and underarms) for at least  48 hours prior to the first CHG shower.  You may shave your face/neck. Please follow these instructions carefully:  1.  Shower with CHG Soap the night before surgery and the  morning of Surgery.  2.  If you choose to wash your hair, wash your hair first as usual with your  normal  shampoo.  3.  After you shampoo, rinse your hair and body thoroughly to remove the  shampoo.                           4.  Use CHG as you would any other liquid soap.  You can apply chg directly  to the skin and wash  Gently with a scrungie or clean washcloth.  5.  Apply the CHG Soap to your body ONLY FROM THE NECK DOWN.   Do not use on face/ open                           Wound or open sores. Avoid contact with eyes, ears mouth and genitals (private parts).                       Wash face,  Genitals (private parts) with your normal soap.             6.  Wash thoroughly, paying special attention to the area where your surgery  will be performed.  7.  Thoroughly rinse your body with warm water from the neck down.  8.  DO NOT shower/wash with your normal soap after using and rinsing off  the CHG Soap.                9.  Pat yourself dry with a clean towel.            10.  Wear clean pajamas.            11.  Place clean sheets on your bed the night of your first shower and do not  sleep with pets. Day of Surgery : Do not apply any lotions/deodorants the morning of surgery.  Please wear clean clothes to the hospital/surgery center.  FAILURE TO FOLLOW THESE INSTRUCTIONS MAY RESULT IN THE CANCELLATION OF YOUR SURGERY PATIENT SIGNATURE_________________________________  NURSE SIGNATURE__________________________________  ________________________________________________________________________    CLEAR LIQUID DIET  THE DAY BEFORE SURGERY   Foods Allowed                                                                     Foods Excluded  Coffee and tea, regular and decaf                              liquids that you cannot  Plain Jell-O in any flavor                                             see through such as: Fruit ices (not with fruit pulp)                                     milk, soups, orange juice  Iced Popsicles                                    All solid food Carbonated beverages, regular and diet                                    Cranberry, grape and apple juices Sports drinks like Gatorade Lightly seasoned clear broth or consume(fat  free) Sugar, honey syrup  Sample Menu Breakfast                                Lunch                                     Supper Cranberry juice                    Beef broth                            Chicken broth Jell-O                                     Grape juice                           Apple juice Coffee or tea                        Jell-O                                      Popsicle                                                Coffee or tea                        Coffee or tea  _____________________________________________________________________

## 2016-11-21 ENCOUNTER — Encounter (HOSPITAL_COMMUNITY)
Admission: RE | Admit: 2016-11-21 | Discharge: 2016-11-21 | Disposition: A | Discharge status: Home / Self Care | Payer: 59 | Source: Ambulatory Visit | Attending: Urology | Admitting: Urology

## 2016-11-21 ENCOUNTER — Encounter (HOSPITAL_COMMUNITY): Payer: Self-pay

## 2016-11-21 DIAGNOSIS — Z01812 Encounter for preprocedural laboratory examination: Secondary | ICD-10-CM | POA: Diagnosis present

## 2016-11-21 DIAGNOSIS — C61 Malignant neoplasm of prostate: Secondary | ICD-10-CM | POA: Insufficient documentation

## 2016-11-21 DIAGNOSIS — M62838 Other muscle spasm: Secondary | ICD-10-CM | POA: Diagnosis not present

## 2016-11-21 DIAGNOSIS — M6281 Muscle weakness (generalized): Secondary | ICD-10-CM | POA: Diagnosis not present

## 2016-11-21 HISTORY — DX: Personal history of urinary calculi: Z87.442

## 2016-11-21 HISTORY — DX: Hyperlipidemia, unspecified: E78.5

## 2016-11-21 HISTORY — DX: Chronic kidney disease, unspecified: N18.9

## 2016-11-21 LAB — BASIC METABOLIC PANEL
ANION GAP: 6 (ref 5–15)
BUN: 15 mg/dL (ref 6–20)
CHLORIDE: 108 mmol/L (ref 101–111)
CO2: 26 mmol/L (ref 22–32)
Calcium: 9 mg/dL (ref 8.9–10.3)
Creatinine, Ser: 1.25 mg/dL — ABNORMAL HIGH (ref 0.61–1.24)
GFR calc Af Amer: >60 mL/min (ref >60)
GLUCOSE: 102 mg/dL — AB (ref 65–99)
POTASSIUM: 4.7 mmol/L (ref 3.5–5.1)
Sodium: 140 mmol/L (ref 135–145)

## 2016-11-21 LAB — CBC
HCT: 43.9 % (ref 39.0–52.0)
HEMOGLOBIN: 15.2 g/dL (ref 13.0–17.0)
MCH: 31.3 pg (ref 26.0–34.0)
MCHC: 34.6 g/dL (ref 30.0–36.0)
MCV: 90.3 fL (ref 78.0–100.0)
Platelets: 248 10*3/uL (ref 150–400)
RBC: 4.86 MIL/uL (ref 4.22–5.81)
RDW: 12.9 % (ref 11.5–15.5)
WBC: 6.3 10*3/uL (ref 4.0–10.5)

## 2016-11-30 ENCOUNTER — Encounter (HOSPITAL_COMMUNITY): Payer: Self-pay | Admitting: Anesthesiology

## 2016-11-30 ENCOUNTER — Observation Stay (HOSPITAL_COMMUNITY)
Admission: RE | Admit: 2016-11-30 | Discharge: 2016-12-01 | Disposition: A | Discharge status: Home / Self Care | Payer: 59 | Source: Ambulatory Visit | Attending: Urology | Admitting: Urology

## 2016-11-30 ENCOUNTER — Encounter (HOSPITAL_COMMUNITY)
Admission: RE | Disposition: A | Discharge status: Home / Self Care | Payer: Self-pay | Source: Ambulatory Visit | Attending: Urology

## 2016-11-30 ENCOUNTER — Ambulatory Visit (HOSPITAL_COMMUNITY): Payer: 59 | Admitting: Anesthesiology

## 2016-11-30 DIAGNOSIS — C61 Malignant neoplasm of prostate: Principal | ICD-10-CM | POA: Diagnosis present

## 2016-11-30 DIAGNOSIS — Z87442 Personal history of urinary calculi: Secondary | ICD-10-CM | POA: Insufficient documentation

## 2016-11-30 DIAGNOSIS — Z23 Encounter for immunization: Secondary | ICD-10-CM | POA: Diagnosis not present

## 2016-11-30 DIAGNOSIS — N183 Chronic kidney disease, stage 3 (moderate): Secondary | ICD-10-CM | POA: Insufficient documentation

## 2016-11-30 DIAGNOSIS — Z79899 Other long term (current) drug therapy: Secondary | ICD-10-CM | POA: Diagnosis not present

## 2016-11-30 DIAGNOSIS — E785 Hyperlipidemia, unspecified: Secondary | ICD-10-CM | POA: Diagnosis not present

## 2016-11-30 HISTORY — PX: LYMPHADENECTOMY: SHX5960

## 2016-11-30 HISTORY — PX: ROBOT ASSISTED LAPAROSCOPIC RADICAL PROSTATECTOMY: SHX5141

## 2016-11-30 LAB — HEMOGLOBIN AND HEMATOCRIT, BLOOD
HEMATOCRIT: 39.6 % (ref 39.0–52.0)
Hemoglobin: 13.8 g/dL (ref 13.0–17.0)

## 2016-11-30 SURGERY — PROSTATECTOMY, RADICAL, ROBOT-ASSISTED, LAPAROSCOPIC
Anesthesia: General

## 2016-11-30 MED ORDER — SUFENTANIL CITRATE 50 MCG/ML IV SOLN
INTRAVENOUS | Status: AC
  Filled 2016-11-30: qty 1

## 2016-11-30 MED ORDER — LACTATED RINGERS IR SOLN
Status: DC | PRN
  Administered 2016-11-30: 1000 mL

## 2016-11-30 MED ORDER — PROPOFOL 10 MG/ML IV BOLUS
INTRAVENOUS | Status: DC | PRN
  Administered 2016-11-30: 160 mg via INTRAVENOUS

## 2016-11-30 MED ORDER — HYDROCODONE-ACETAMINOPHEN 5-325 MG PO TABS: 1.0000 | ORAL_TABLET | Freq: Four times a day (QID) | ORAL | 0 refills | Status: DC | PRN

## 2016-11-30 MED ORDER — DEXTROSE-NACL 5-0.45 % IV SOLN
INTRAVENOUS | Status: DC
  Administered 2016-11-30: 20:00:00 via INTRAVENOUS

## 2016-11-30 MED ORDER — SUFENTANIL CITRATE 50 MCG/ML IV SOLN
INTRAVENOUS | Status: DC | PRN
  Administered 2016-11-30 (×2): 10 ug via INTRAVENOUS
  Administered 2016-11-30: 20 ug via INTRAVENOUS
  Administered 2016-11-30: 10 ug via INTRAVENOUS

## 2016-11-30 MED ORDER — SODIUM CHLORIDE 0.9 % IJ SOLN
INTRAMUSCULAR | Status: DC | PRN
  Administered 2016-11-30: 20 mL

## 2016-11-30 MED ORDER — HYDROMORPHONE HCL 1 MG/ML IJ SOLN
INTRAMUSCULAR | Status: DC | PRN
  Administered 2016-11-30 (×5): .4 mg via INTRAVENOUS

## 2016-11-30 MED ORDER — MIDAZOLAM HCL 2 MG/2ML IJ SOLN
INTRAMUSCULAR | Status: DC | PRN
  Administered 2016-11-30: 2 mg via INTRAVENOUS

## 2016-11-30 MED ORDER — HYDROMORPHONE HCL 2 MG/ML IJ SOLN
INTRAMUSCULAR | Status: AC
  Filled 2016-11-30: qty 1

## 2016-11-30 MED ORDER — ROCURONIUM BROMIDE 50 MG/5ML IV SOSY
PREFILLED_SYRINGE | INTRAVENOUS | Status: AC
  Filled 2016-11-30: qty 5

## 2016-11-30 MED ORDER — MIDAZOLAM HCL 2 MG/2ML IJ SOLN
INTRAMUSCULAR | Status: AC
  Filled 2016-11-30: qty 2

## 2016-11-30 MED ORDER — SODIUM CHLORIDE 0.9 % IJ SOLN
INTRAMUSCULAR | Status: AC
  Filled 2016-11-30: qty 10

## 2016-11-30 MED ORDER — SULFAMETHOXAZOLE-TRIMETHOPRIM 800-160 MG PO TABS: 1.0000 | ORAL_TABLET | Freq: Two times a day (BID) | ORAL | 0 refills | Status: DC

## 2016-11-30 MED ORDER — SODIUM CHLORIDE 0.9 % IJ SOLN
INTRAMUSCULAR | Status: AC
  Filled 2016-11-30: qty 20

## 2016-11-30 MED ORDER — ONDANSETRON HCL 4 MG/2ML IJ SOLN
INTRAMUSCULAR | Status: AC
  Filled 2016-11-30: qty 2

## 2016-11-30 MED ORDER — PROMETHAZINE HCL 25 MG/ML IJ SOLN: 6.2500 mg | INTRAMUSCULAR | Status: DC | PRN

## 2016-11-30 MED ORDER — FENOFIBRATE 160 MG PO TABS
160.0000 mg | ORAL_TABLET | Freq: Every evening | ORAL | Status: DC
  Filled 2016-11-30: qty 1

## 2016-11-30 MED ORDER — SUGAMMADEX SODIUM 200 MG/2ML IV SOLN
INTRAVENOUS | Status: DC | PRN
  Administered 2016-11-30: 170 mg via INTRAVENOUS

## 2016-11-30 MED ORDER — LIDOCAINE 2% (20 MG/ML) 5 ML SYRINGE
INTRAMUSCULAR | Status: AC
  Filled 2016-11-30: qty 5

## 2016-11-30 MED ORDER — DIPHENHYDRAMINE HCL 50 MG/ML IJ SOLN: 12.5000 mg | Freq: Four times a day (QID) | INTRAMUSCULAR | Status: DC | PRN

## 2016-11-30 MED ORDER — DEXAMETHASONE SODIUM PHOSPHATE 10 MG/ML IJ SOLN
INTRAMUSCULAR | Status: DC | PRN
  Administered 2016-11-30: 10 mg via INTRAVENOUS

## 2016-11-30 MED ORDER — ROCURONIUM BROMIDE 10 MG/ML (PF) SYRINGE
PREFILLED_SYRINGE | INTRAVENOUS | Status: DC | PRN
  Administered 2016-11-30: 20 mg via INTRAVENOUS
  Administered 2016-11-30: 50 mg via INTRAVENOUS
  Administered 2016-11-30 (×2): 10 mg via INTRAVENOUS

## 2016-11-30 MED ORDER — LACTATED RINGERS IV SOLN
INTRAVENOUS | Status: DC
  Administered 2016-11-30 (×2): via INTRAVENOUS
  Administered 2016-11-30: 20 mL via INTRAVENOUS
  Administered 2016-11-30: 16:00:00 via INTRAVENOUS

## 2016-11-30 MED ORDER — HYDROMORPHONE HCL-NACL 0.5-0.9 MG/ML-% IV SOSY
PREFILLED_SYRINGE | INTRAVENOUS | Status: AC
  Filled 2016-11-30: qty 2

## 2016-11-30 MED ORDER — PROPOFOL 10 MG/ML IV BOLUS
INTRAVENOUS | Status: AC
  Filled 2016-11-30: qty 20

## 2016-11-30 MED ORDER — INFLUENZA VAC SPLIT QUAD 0.5 ML IM SUSY
0.5000 mL | PREFILLED_SYRINGE | INTRAMUSCULAR | Status: AC
  Administered 2016-12-01: 0.5 mL via INTRAMUSCULAR
  Filled 2016-11-30: qty 0.5

## 2016-11-30 MED ORDER — MAGNESIUM CITRATE PO SOLN
1.0000 | Freq: Once | ORAL | Status: DC
  Filled 2016-11-30: qty 296

## 2016-11-30 MED ORDER — ONDANSETRON HCL 4 MG/2ML IJ SOLN: 4.0000 mg | INTRAMUSCULAR | Status: DC | PRN

## 2016-11-30 MED ORDER — HYDROMORPHONE HCL-NACL 0.5-0.9 MG/ML-% IV SOSY: 0.2500 mg | PREFILLED_SYRINGE | INTRAVENOUS | Status: DC | PRN

## 2016-11-30 MED ORDER — ONDANSETRON HCL 4 MG/2ML IJ SOLN
INTRAMUSCULAR | Status: DC | PRN
  Administered 2016-11-30: 4 mg via INTRAVENOUS

## 2016-11-30 MED ORDER — LIDOCAINE 2% (20 MG/ML) 5 ML SYRINGE
INTRAMUSCULAR | Status: DC | PRN
  Administered 2016-11-30: 100 mg via INTRAVENOUS

## 2016-11-30 MED ORDER — CEFAZOLIN SODIUM-DEXTROSE 2-4 GM/100ML-% IV SOLN
2.0000 g | INTRAVENOUS | Status: AC
  Administered 2016-11-30: 2 g via INTRAVENOUS
  Filled 2016-11-30: qty 100

## 2016-11-30 MED ORDER — SUGAMMADEX SODIUM 200 MG/2ML IV SOLN
INTRAVENOUS | Status: AC
  Filled 2016-11-30: qty 2

## 2016-11-30 MED ORDER — SODIUM CHLORIDE 0.9 % IV BOLUS (SEPSIS)
1000.0000 mL | Freq: Once | INTRAVENOUS | Status: AC
  Administered 2016-11-30: 1000 mL via INTRAVENOUS

## 2016-11-30 MED ORDER — DIPHENHYDRAMINE HCL 12.5 MG/5ML PO ELIX: 12.5000 mg | ORAL_SOLUTION | Freq: Four times a day (QID) | ORAL | Status: DC | PRN

## 2016-11-30 MED ORDER — BUPIVACAINE LIPOSOME 1.3 % IJ SUSP
20.0000 mL | Freq: Once | INTRAMUSCULAR | Status: AC
  Administered 2016-11-30: 20 mL
  Filled 2016-11-30: qty 20

## 2016-11-30 MED ORDER — OXYCODONE HCL 5 MG PO TABS
5.0000 mg | ORAL_TABLET | ORAL | Status: DC | PRN
  Administered 2016-12-01: 5 mg via ORAL
  Filled 2016-11-30: qty 1

## 2016-11-30 MED ORDER — ATORVASTATIN CALCIUM 10 MG PO TABS: 10.0000 mg | ORAL_TABLET | Freq: Every evening | ORAL | Status: DC

## 2016-11-30 MED ORDER — HYDROMORPHONE HCL-NACL 0.5-0.9 MG/ML-% IV SOSY: 0.5000 mg | PREFILLED_SYRINGE | INTRAVENOUS | Status: DC | PRN

## 2016-11-30 MED ORDER — ACETAMINOPHEN 500 MG PO TABS
1000.0000 mg | ORAL_TABLET | Freq: Four times a day (QID) | ORAL | Status: AC
  Administered 2016-11-30 – 2016-12-01 (×4): 1000 mg via ORAL
  Filled 2016-11-30 (×4): qty 2

## 2016-11-30 SURGICAL SUPPLY — 68 items
APPLICATOR COTTON TIP 6IN STRL (MISCELLANEOUS) ×3 IMPLANT
BAG URO CATCHER STRL LF (MISCELLANEOUS) ×3 IMPLANT
CATH FOLEY 2WAY SLVR 18FR 30CC (CATHETERS) ×3 IMPLANT
CATH TIEMANN FOLEY 18FR 5CC (CATHETERS) ×3 IMPLANT
CHLORAPREP W/TINT 26ML (MISCELLANEOUS) ×3 IMPLANT
CLIP VESOLOCK LG 6/CT PURPLE (CLIP) ×12 IMPLANT
CLOTH BEACON ORANGE TIMEOUT ST (SAFETY) ×3 IMPLANT
CONT SPEC 4OZ CLIKSEAL STRL BL (MISCELLANEOUS) ×3 IMPLANT
COVER FOOTSWITCH UNIV (MISCELLANEOUS) IMPLANT
COVER SURGICAL LIGHT HANDLE (MISCELLANEOUS) ×3 IMPLANT
COVER TIP SHEARS 8 DVNC (MISCELLANEOUS) ×2 IMPLANT
COVER TIP SHEARS 8MM DA VINCI (MISCELLANEOUS) ×1
CUTTER ECHEON FLEX ENDO 45 340 (ENDOMECHANICALS) ×3 IMPLANT
DECANTER SPIKE VIAL GLASS SM (MISCELLANEOUS) IMPLANT
DERMABOND ADVANCED (GAUZE/BANDAGES/DRESSINGS) ×1
DERMABOND ADVANCED .7 DNX12 (GAUZE/BANDAGES/DRESSINGS) ×2 IMPLANT
DRAPE ARM DVNC X/XI (DISPOSABLE) ×8 IMPLANT
DRAPE COLUMN DVNC XI (DISPOSABLE) ×2 IMPLANT
DRAPE DA VINCI XI ARM (DISPOSABLE) ×4
DRAPE DA VINCI XI COLUMN (DISPOSABLE) ×1
DRAPE SURG IRRIG POUCH 19X23 (DRAPES) ×3 IMPLANT
DRSG TEGADERM 4X4.75 (GAUZE/BANDAGES/DRESSINGS) ×3 IMPLANT
ELECT REM PT RETURN 15FT ADLT (MISCELLANEOUS) ×3 IMPLANT
ETHIBOND 2 0 GREEN CT 2 30IN (SUTURE) ×3 IMPLANT
GAUZE SPONGE 2X2 8PLY STRL LF (GAUZE/BANDAGES/DRESSINGS) IMPLANT
GLOVE BIO SURGEON STRL SZ 6.5 (GLOVE) ×3 IMPLANT
GLOVE BIOGEL M STRL SZ7.5 (GLOVE) ×6 IMPLANT
GLOVE BIOGEL PI IND STRL 7.5 (GLOVE) ×2 IMPLANT
GLOVE BIOGEL PI INDICATOR 7.5 (GLOVE) ×1
GOWN STRL REUS W/TWL LRG LVL3 (GOWN DISPOSABLE) ×9 IMPLANT
GOWN STRL REUS W/TWL XL LVL3 (GOWN DISPOSABLE) ×3 IMPLANT
HOLDER FOLEY CATH W/STRAP (MISCELLANEOUS) ×3 IMPLANT
IRRIG SUCT STRYKERFLOW 2 WTIP (MISCELLANEOUS) ×3
IRRIGATION SUCT STRKRFLW 2 WTP (MISCELLANEOUS) ×2 IMPLANT
IV LACTATED RINGERS 1000ML (IV SOLUTION) ×3 IMPLANT
KIT PROCEDURE DA VINCI SI (MISCELLANEOUS) ×1
KIT PROCEDURE DVNC SI (MISCELLANEOUS) ×2 IMPLANT
MANIFOLD NEPTUNE II (INSTRUMENTS) ×3 IMPLANT
NEEDLE INSUFFLATION 14GA 120MM (NEEDLE) ×3 IMPLANT
NEEDLE SPNL 22GX7 QUINCKE BK (NEEDLE) ×3 IMPLANT
PACK CYSTO (CUSTOM PROCEDURE TRAY) ×3 IMPLANT
PACK ROBOT UROLOGY CUSTOM (CUSTOM PROCEDURE TRAY) ×3 IMPLANT
PAD POSITIONING PINK XL (MISCELLANEOUS) ×3 IMPLANT
PORT ACCESS TROCAR AIRSEAL 12 (TROCAR) ×2 IMPLANT
PORT ACCESS TROCAR AIRSEAL 5M (TROCAR) ×1
SEAL CANN UNIV 5-8 DVNC XI (MISCELLANEOUS) ×8 IMPLANT
SEAL XI 5MM-8MM UNIVERSAL (MISCELLANEOUS) ×4
SET TRI-LUMEN FLTR TB AIRSEAL (TUBING) ×3 IMPLANT
SOLUTION ELECTROLUBE (MISCELLANEOUS) ×3 IMPLANT
SPONGE GAUZE 2X2 STER 10/PKG (GAUZE/BANDAGES/DRESSINGS)
SPONGE LAP 4X18 X RAY DECT (DISPOSABLE) ×3 IMPLANT
STAPLE RELOAD 45 GRN (STAPLE) ×2 IMPLANT
STAPLE RELOAD 45MM GREEN (STAPLE) ×1
SUT ETHILON 3 0 PS 1 (SUTURE) ×3 IMPLANT
SUT MNCRL AB 4-0 PS2 18 (SUTURE) ×6 IMPLANT
SUT PDS AB 1 CT1 27 (SUTURE) ×6 IMPLANT
SUT VIC AB 2-0 SH 27 (SUTURE) ×1
SUT VIC AB 2-0 SH 27X BRD (SUTURE) ×2 IMPLANT
SUT VICRYL 0 UR6 27IN ABS (SUTURE) ×3 IMPLANT
SUT VLOC BARB 180 ABS3/0GR12 (SUTURE) ×9
SUTURE VLOC BRB 180 ABS3/0GR12 (SUTURE) ×6 IMPLANT
SYR 27GX1/2 1ML LL SAFETY (SYRINGE) ×3 IMPLANT
SYR CONTROL 10ML LL (SYRINGE) IMPLANT
TOWEL OR 17X26 10 PK STRL BLUE (TOWEL DISPOSABLE) IMPLANT
TOWEL OR NON WOVEN STRL DISP B (DISPOSABLE) ×3 IMPLANT
TUBING CONNECTING 10 (TUBING) IMPLANT
WATER STERILE IRR 1000ML POUR (IV SOLUTION) ×6 IMPLANT
WATER STERILE IRR 3000ML UROMA (IV SOLUTION) ×3 IMPLANT

## 2016-11-30 NOTE — Discharge Instructions (Signed)

## 2016-11-30 NOTE — Anesthesia Procedure Notes (Signed)
Procedure Name: Intubation Date/Time: 11/30/2016 1:47 PM Performed by: Danley Danker L Patient Re-evaluated:Patient Re-evaluated prior to induction Oxygen Delivery Method: Circle system utilized Preoxygenation: Pre-oxygenation with 100% oxygen Induction Type: IV induction Ventilation: Mask ventilation without difficulty Laryngoscope Size: Miller and 3 Grade View: Grade I Tube type: Oral Tube size: 8.0 mm Number of attempts: 1 Airway Equipment and Method: Stylet Placement Confirmation: ETT inserted through vocal cords under direct vision,  positive ETCO2 and breath sounds checked- equal and bilateral Secured at: 21 cm Tube secured with: Tape Dental Injury: Teeth and Oropharynx as per pre-operative assessment

## 2016-11-30 NOTE — H&P (Signed)
Johnny Burns is an 56 y.o. male.    Chief Complaint: Pre-op Robotic Prostatectomy with Pelvic Lymphadenectomy  HPI:   1 - High Risk Prostate Cancer - 10/12 cores up to 90% Gleason 4+5=9 disease. All base and lateral cores involved on eval rising PSA to 5.2. TRUS 73mL. CT and Bone Scan 09/2016 clinically localized.   2 - Stage 3 Renal Insufficiency - Cr 1.3's x many / GFR mid 50s. Stable.    PMH sig for onchomycosis, right inguinal hernia repair. His PCP is Azalia Bilis MD with [facility].    Today Johnny Burns is seen to proceed with prostatectomy.     Past Medical History:  Diagnosis Date  . Chronic kidney disease    Stage 3 sees primary physcian  . History of kidney stones   . Hyperlipidemia   . Left ureteral calculus     Past Surgical History:  Procedure Laterality Date  . CYSTOSCOPY WITH RETROGRADE PYELOGRAM, URETEROSCOPY AND STENT PLACEMENT Left 11/30/2012   Procedure: CYSTOSCOPY WITH LEFT RETROGRADE PYELOGRAM, LEFT URETEROSCOPY LEFT STONE EXTRACTION WITH BASKET  AND LEFT STENT PLACEMENT;  Surgeon: Alexis Frock, MD;  Location: [facility];  Service: Urology;  Laterality: Left;  . EXTRACORPOREAL SHOCK WAVE LITHOTRIPSY Left 10-08-2012  . INGUINAL HERNIA REPAIR Right 1990  . XI ROBOTIC ASSISTED SIMPLE PROSTATECTOMY     Dr. Tresa Moore 11/30/16    History reviewed. No pertinent family history. Social History:  reports that he has never smoked. He has never used smokeless tobacco. He reports that he does not drink alcohol or use drugs.  Allergies: No Known Allergies  Medications Prior to Admission  Medication Sig Dispense Refill  . atorvastatin (LIPITOR) 10 MG tablet Take 10 mg by mouth every evening.    . cetirizine (ZYRTEC) 10 MG tablet Take 10 mg by mouth daily.    . fenofibrate 160 MG tablet Take 160 mg by mouth every evening.      No results found for this or any previous visit (from the past 48 hour(s)). No results found.  Review of Systems   Constitutional: Negative for chills and fever.  HENT: Negative.   Eyes: Negative.   Respiratory: Negative.   Cardiovascular: Negative.   Gastrointestinal: Negative.   Genitourinary: Negative.   Musculoskeletal: Negative.   Skin: Negative.   Neurological: Negative.   Endo/Heme/Allergies: Negative.   Psychiatric/Behavioral: Negative.     Blood pressure 130/83, pulse 74, temperature 98.2 F (36.8 C), temperature source Oral, resp. rate 16, SpO2 100 %. Physical Exam  Constitutional: He appears well-developed.  HENT:  Head: Normocephalic.  Eyes: Pupils are equal, round, and reactive to light.  Neck: Normal range of motion.  Cardiovascular: Normal rate.   Respiratory: Effort normal.  GI: Soft.  Genitourinary:  Genitourinary Comments: NO CVAT  Musculoskeletal: Normal range of motion.  Neurological: He is alert.  Skin: Skin is warm.  Psychiatric: He has a normal mood and affect.     Assessment/Plan   Proceed as planned with prostatectomy / pelvic lymphadenectomy. Risks, benefits, alternatives, expected peri-op course including plan for home with foley discussed previously and reiterated today.   Alexis Frock, MD 11/30/2016, 11:23 AM

## 2016-11-30 NOTE — Anesthesia Preprocedure Evaluation (Addendum)
Anesthesia Evaluation  Patient identified by MRN, date of birth, ID band Patient awake    Reviewed: Allergy & Precautions, NPO status , Patient's Chart, lab work & pertinent test results  Airway Mallampati: II  TM Distance: >3 FB Neck ROM: Full    Dental   Pulmonary neg pulmonary ROS,    breath sounds clear to auscultation       Cardiovascular negative cardio ROS   Rhythm:Regular Rate:Normal     Neuro/Psych negative neurological ROS     GI/Hepatic negative GI ROS, Neg liver ROS,   Endo/Other  negative endocrine ROS  Renal/GU Renal disease     Musculoskeletal   Abdominal   Peds  Hematology negative hematology ROS (+)   Anesthesia Other Findings   Reproductive/Obstetrics                            Lab Results  Component Value Date   WBC 6.3 11/21/2016   HGB 15.2 11/21/2016   HCT 43.9 11/21/2016   MCV 90.3 11/21/2016   PLT 248 11/21/2016   Lab Results  Component Value Date   CREATININE 1.25 (H) 11/21/2016   BUN 15 11/21/2016   NA 140 11/21/2016   K 4.7 11/21/2016   CL 108 11/21/2016   CO2 26 11/21/2016    Anesthesia Physical Anesthesia Plan  ASA: II  Anesthesia Plan: General   Post-op Pain Management:    Induction: Intravenous  PONV Risk Score and Plan: 3 and Ondansetron, Dexamethasone, Midazolam and Treatment may vary due to age or medical condition  Airway Management Planned: Oral ETT  Additional Equipment:   Intra-op Plan:   Post-operative Plan: Extubation in OR  Informed Consent: I have reviewed the patients History and Physical, chart, labs and discussed the procedure including the risks, benefits and alternatives for the proposed anesthesia with the patient or authorized representative who has indicated his/her understanding and acceptance.   Dental advisory given  Plan Discussed with: CRNA  Anesthesia Plan Comments:        Anesthesia Quick  Evaluation

## 2016-11-30 NOTE — Transfer of Care (Signed)
Immediate Anesthesia Transfer of Care Note  Patient: Johnny Burns  Procedure(s) Performed: XI ROBOTIC ASSISTED LAPAROSCOPIC RADICAL PROSTATECTOMY (N/A ) PELVIC LYMPHADENECTOMY (Bilateral )  Patient Location: PACU  Anesthesia Type:General  Level of Consciousness: awake  Airway & Oxygen Therapy: Patient Spontanous Breathing and Patient connected to face mask oxygen  Post-op Assessment: Report given to RN and Post -op Vital signs reviewed and stable  Post vital signs: Reviewed and stable  Last Vitals:  Vitals:   11/30/16 1105 11/30/16 1124  BP: 130/83 130/83  Pulse: 74 74  Resp: 16 18  Temp: 36.8 C 36.8 C  SpO2: 100% 100%    Last Pain:  Vitals:   11/30/16 1124  TempSrc: Oral      Patients Stated Pain Goal: 4 (08/65/78 4696)  Complications: No apparent anesthesia complications

## 2016-11-30 NOTE — Interval H&P Note (Signed)
History and Physical Interval Note:  11/30/2016 1:33 PM  Johnny Burns  has presented today for surgery, with the diagnosis of HIGH RISK PROSTATE CANCER  The various methods of treatment have been discussed with the patient and family. After consideration of risks, benefits and other options for treatment, the patient has consented to  Procedure(s): XI ROBOTIC ASSISTED LAPAROSCOPIC RADICAL PROSTATECTOMY (N/A) PELVIC LYMPHADENECTOMY (Bilateral) CYSTOSCOPY WITH INJECTION OF INDOCYANINE GREEN DYE (N/A) as a surgical intervention .  The patient's history has been reviewed, patient examined, no change in status, stable for surgery.  I have reviewed the patient's chart and labs.  Questions were answered to the patient's satisfaction.     [name]

## 2016-11-30 NOTE — Anesthesia Postprocedure Evaluation (Signed)
Anesthesia Post Note  Patient: Johnny Burns  Procedure(s) Performed: XI ROBOTIC ASSISTED LAPAROSCOPIC RADICAL PROSTATECTOMY (N/A ) PELVIC LYMPHADENECTOMY (Bilateral )     Patient location during evaluation: PACU Anesthesia Type: General Level of consciousness: awake and alert Pain management: pain level controlled Vital Signs Assessment: post-procedure vital signs reviewed and stable Respiratory status: spontaneous breathing, nonlabored ventilation, respiratory function stable and patient connected to nasal cannula oxygen Cardiovascular status: blood pressure returned to baseline and stable Postop Assessment: no apparent nausea or vomiting Anesthetic complications: no    Last Vitals:  Vitals:   11/30/16 1730 11/30/16 1745  BP: 118/78 126/81  Pulse: 74 78  Resp: 10 15  Temp:    SpO2: 99% 100%    Last Pain:  Vitals:   11/30/16 1124  TempSrc: Oral                 Tiajuana Amass

## 2016-11-30 NOTE — Brief Op Note (Signed)
11/30/2016  4:41 PM  PATIENT:  Gwenyth Bouillon  56 y.o. male  PRE-OPERATIVE DIAGNOSIS:  HIGH RISK PROSTATE CANCER  POST-OPERATIVE DIAGNOSIS:  HIGH RISK PROSTATE CANCER  PROCEDURE:  Procedure(s): XI ROBOTIC ASSISTED LAPAROSCOPIC RADICAL PROSTATECTOMY (N/A) PELVIC LYMPHADENECTOMY (Bilateral)  SURGEON:  Surgeon(s) and Role:    Alexis Frock, MD - Primary  PHYSICIAN ASSISTANT:   ASSISTANTS: Clemetine Marker PA   ANESTHESIA:   local and general  EBL:  Total I/O In: 2100 [I.V.:2100] Out: 250 [Blood:250]  BLOOD ADMINISTERED:none  DRAINS: 1 - JP to bulb; 2 - Foley to gravity   LOCAL MEDICATIONS USED:  MARCAINE     SPECIMEN:  Source of Specimen:  1 - pelvic lymph nodes, 2 - prostatectomy  DISPOSITION OF SPECIMEN:  PATHOLOGY  COUNTS:  YES  TOURNIQUET:  * No tourniquets in log *  DICTATION: .Other Dictation: Dictation Number  [id number]  PLAN OF CARE: Admit for overnight observation  PATIENT DISPOSITION:  PACU - hemodynamically stable.   Delay start of Pharmacological VTE agent (>24hrs) due to surgical blood loss or risk of bleeding: yes

## 2016-11-30 NOTE — Interval H&P Note (Signed)
History and Physical Interval Note:  11/30/2016 1:34 PM  Johnny Burns  has presented today for surgery, with the diagnosis of HIGH RISK PROSTATE CANCER  The various methods of treatment have been discussed with the patient and family. After consideration of risks, benefits and other options for treatment, the patient has consented to  Procedure(s): XI ROBOTIC ASSISTED LAPAROSCOPIC RADICAL PROSTATECTOMY (N/A) PELVIC LYMPHADENECTOMY (Bilateral) CYSTOSCOPY WITH INJECTION OF INDOCYANINE GREEN DYE (N/A) as a surgical intervention .  The patient's history has been reviewed, patient examined, no change in status, stable for surgery.  I have reviewed the patient's chart and labs.  Questions were answered to the patient's satisfaction.     Grayson White

## 2016-12-01 DIAGNOSIS — C61 Malignant neoplasm of prostate: Secondary | ICD-10-CM | POA: Diagnosis not present

## 2016-12-01 LAB — BASIC METABOLIC PANEL
ANION GAP: 5 (ref 5–15)
BUN: 14 mg/dL (ref 6–20)
CHLORIDE: 108 mmol/L (ref 101–111)
CO2: 23 mmol/L (ref 22–32)
Calcium: 8.5 mg/dL — ABNORMAL LOW (ref 8.9–10.3)
Creatinine, Ser: 1.11 mg/dL (ref 0.61–1.24)
GFR calc Af Amer: >60 mL/min (ref >60)
GFR calc non Af Amer: >60 mL/min (ref >60)
GLUCOSE: 170 mg/dL — AB (ref 65–99)
POTASSIUM: 4.4 mmol/L (ref 3.5–5.1)
Sodium: 136 mmol/L (ref 135–145)

## 2016-12-01 LAB — HEMOGLOBIN AND HEMATOCRIT, BLOOD
HCT: 36.2 % — ABNORMAL LOW (ref 39.0–52.0)
HEMOGLOBIN: 12.8 g/dL — AB (ref 13.0–17.0)

## 2016-12-01 LAB — CREATININE, FLUID (PLEURAL, PERITONEAL, JP DRAINAGE): CREAT FL: 1.2 mg/dL

## 2016-12-01 NOTE — Op Note (Signed)
NAME:  Johnny Burns, Johnny Burns NO.:  MEDICAL RECORD NO.:  [id number]  LOCATION:                                 FACILITY:  PHYSICIAN:  Alexis Frock, MD          DATE OF BIRTH:  DATE OF PROCEDURE: 11/30/2016                              OPERATIVE REPORT   DIAGNOSIS:  High-risk prostate cancer.  PROCEDURES: 1. Robotic-assisted laparoscopic radical prostatectomy with bilateral     pelvic lymphadenectomy. 2. Indocyanine green dye for sentinel lymphangiography.  ESTIMATED BLOOD LOSS:  Less than 100 mL.  COMPLICATION:  None.  SPECIMENS: 1. Right pelvic lymph nodes (external iliac plus obturator) for     permanent pathology. 2. Left external iliac lymph nodes. 3. Left obturator lymph nodes. 4. Radical prostatectomy. 5. Periprostatic fat.  DRAINS: 1. Jackson-Pratt drain to bulb suction. 2. Foley catheter to straight drain.  FINDINGS:  No evidence of sentinel lymph nodes within the pelvis following administration of intraprostatic indocyanine green dye.  ASSISTANT:  Debbrah Alar, PA.  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  He underwent staging imaging, which revealed no evidence of locally advanced or distant disease.  Options for management were discussed including surveillance protocols versus ablative therapies versus surgical extirpation and he adamantly wished to proceed with robotic prostatectomy.  Informed consent was obtained and placed in the medical record.  PROCEDURE IN DETAIL:  The patient being Johnny Burns, was verified. Procedure being robotic prostatectomy was confirmed.  Procedure was carried out.  Time-out was performed.  Intravenous antibiotics were administered.  General endotracheal anesthesia was introduced.  The patient was placed into a low  lithotomy position and sterile field was created by prepping and draping the patient's penis, perineum and proximal thighs using iodine and his infra-xiphoid abdomen using chlorhexidine gluconate.  He has further fashioned to the operating table using 3-inch tape over foam padding across his supraxiphoid chest. A test of steep Trendelenburg positioning was performed, he was found to be suitably positioned.  Foley catheter was placed per urethra to straight drain.  Next, a high-flow, low-pressure pneumoperitoneum was obtained using Veress technique in the supraumbilical midline having passed the aspiration and drop test.  An 8-mm robotic camera port was placed into the same location.  Laparoscopic examination of the peritoneal cavity revealed no significant adhesions and no visceral injury.  Additional ports were placed as follows:  Right paramedian 8-mm robotic port, right far lateral 12-mm assistant port, AirSeal type, right paramedian 5-mm suction port, left paramedian 8-mm robotic port, left far lateral 8-mm robotic port.  Robot was docked and passed through the electronic checks.  Initial attention was directed at development of space of Retzius, and incision was made lateral to the right medial umbilical ligament from the midline towards the area of the internal ring coursing along the iliac vessels towards the area of the ureter. The vas deferens was encountered and purposely ligated using medial bucket-handle as the lateral aspect of the bladder was swept away from the pelvic sidewall towards  the endopelvic fascia on the right side.  A mirror-imaged dissection was performed on the left side releasing the left bladder away from the left pelvic sidewall.  Anterior attachments were taken down with cautery scissors exposing the anterior base of the prostate bladder neck junction.  This was defatted with this fat, set aside, labeled as periprostatic fat.  Next, 0.2 mL of indocyanine  green dye was injected into each lobe of the prostate using a percutaneously- placed, robotically-guided spinal needle with intervening suction to avoid dye spillage, which did not occur.  Next, the endopelvic fascia was swept away from the lateral aspect of the prostate in a base-to-apex orientation.  This exposed the dorsal venous complex, which was controlled using vascular load stapler taking exquisite care to avoid membranous urethral injury, which did not occur.  It had been approximately 10 minutes post dye injection and the pelvis was inspected under near-infrared fluorescence light.  Sentinel lymphangiography revealed no evidence of obvious sentinel lymphatic channels or lymph nodes within the pelvis.  As such, template lymphadenectomy was performed on the right side of the right external iliac lymph nodes, confines being right external iliac artery, vein, pelvic side wall, iliac bifurcation.  Lymphostasis was achieved with cold clips.  Similarly, the right obturator group was dissected free with confines being right obturator nerve, pelvic side wall and external iliac vein.  Lymphostasis was achieved with cold clips.  Great care was taken to avoid injury to the obturator nerve, which did not occur.  This was set aside, labeled as right pelvic lymph nodes.  The pelvis was once again inspected on the right side under near-infrared fluorescence light, no evidence of sentinel lymph nodes were noted.  Similarly, template lymphadenectomy was performed on the left side of left external iliac and left obturator groups respectively.  Additionally, no sentinel lymph nodes were seen on this side and the obturator nerve was inspected following these maneuvers and found to be uninjured.  Attention was then directed at bladder neck dissection.  Bladder neck was identified in the anterior plane and lateral release was performed and the bladder neck was separated from the base of the prostate  in anterior-posterior direction keeping what appeared to be a rim of circular muscle fibers with each side of the dissection.  Posterior dissection was performed by incising approximately 7 mm inferior-posterior to the posterior lip of the prostate entering the plane of Denonvilliers.  Bilateral vasa deferentia were encountered, dissected for distance approximately 4 cm, ligated and placed on gentle superior traction.  Bilateral seminal vesicles were dissected to their tip and also placed on gentle superior traction.  There was significant desmoplastic reaction around the seminal vesicles.  This exposed the vascular pedicles on each side. These were controlled using a sequential clipping technique in a base-to- apex orientation performing purposely wide dissection given the high risk indication.  Apical dissection was performed in the anterior plane by placing the prostatectomy specimen on superior traction coldly transecting the membranous urethra keeping what appeared to be an adequate membranous urethral stump.  This completely freed up the prostatectomy specimen, which was placed into an EndoCatch bag for later retrieval.  Digital rectal exam was then performed under laparoscopic vision with indicator glove.  No evidence of rectal violation was noted. Next, posterior reconstruction was performed using a running 3-0 V-Loc suture reapproximating the posterior urethral plate to the posterior bladder neck tissue bringing these structures in a tension-free apposition.  Next, mucosa-to-mucosa anastomosis was performed using double-armed 3-0 V-Loc  suture from the 6 o'clock to 12 o'clock position, which resulted in excellent mucosal apposition of the bladder neck and membranous urethral stump.  New Foley catheter was then placed, irrigated quantitatively.  All sponge and needle counts were correct. Hemostasis appeared excellent.  Closed suction drain was brought through the previous left  lateral most robotic port site to the area of the peritoneal cavity.  The right 12-mm assistant port site was closed at the level of the fascia using Carter-Thomason suture passer under laparoscopic vision.  Robot was then undocked.  Specimen was retrieved by extending the previous camera port site superiorly for distance approximately 2.5 cm removing the prostatectomy specimen, setting aside for permanent pathology.  The extraction site was closed at the level of the fascia using figure-of-eight PDS x2 followed by reapproximation of Scarpa's with running Vicryl.  All incision sites were infiltrated with dilute lyophilized Marcaine and closed at the level of the skin using subcuticular Monocryl followed by Dermabond.  Procedure was then terminated.  The patient tolerated the procedure well.  There were no immediate periprocedural complications.  The patient was taken to the postanesthesia care unit in stable condition.  Please note, first assistant, Debbrah Alar, was absolutely crucial for all robotic portions of the procedure today, she provided invaluable retraction, clip application, vascular stapling, suctioning; without which, this would not be possible.          ______________________________ Alexis Frock, MD     TM/MEDQ  D:  11/30/2016  T:  11/30/2016  Job:  [id number]

## 2016-12-01 NOTE — Discharge Summary (Signed)
[facility] Discharge Summary  Admit date: 11/30/2016  Discharge date and time: 12/01/16   Discharge to: Home  Discharge Service: Urology  Discharge Attending Physician:  Dr. Alexis Frock  Discharge  Diagnoses: Prostate Cancer  Secondary Diagnosis: Active Problems:   Prostate cancer [facility])   OR Procedures: Procedure(s): XI ROBOTIC ASSISTED LAPAROSCOPIC RADICAL PROSTATECTOMY PELVIC LYMPHADENECTOMY 11/30/2016   Ancillary Procedures: None   Discharge Day Services: The patient was seen and examined by the Urology team both in the morning and immediately prior to discharge.  Vital signs and laboratory values were stable and within normal limits.  The physical exam was benign and unchanged and all surgical wounds were examined.  Discharge instructions were explained and all questions answered.  Subjective  No acute events overnight. Pain Controlled. No fever or chills.  Objective Patient Vitals for the past 8 hrs:  BP Temp Temp src Pulse Resp SpO2  12/01/16 0619 105/71 98.2 F (36.8 C) Axillary 72 16 99 %  12/01/16 0203 127/70 98.2 F (36.8 C) Oral 77 18 98 %   No intake/output data recorded.  General Appearance:        No acute distress Lungs:                       Normal work of breathing on room air Heart:                                Regular rate and rhythm Abdomen:                         Soft, non-tender, non-distended. Incisions c/d/i. Former JP site dressed with gauze.  GU:         Foley in place draining clear yellow urine Extremities:                      Warm and well perfused   Hospital Course:  The patient underwent a robotic assisted radical prostatectomy on 11/30/2016.  The patient tolerated the procedure well, was extubated in the OR, and afterwards was taken to the PACU for routine post-surgical care. When stable the patient was transferred to the floor.   The patient did well postoperatively.  The patient's diet was slowly advanced and at the time of  discharge was tolerating a regular diet.  The patient was discharged home 1 Day Post-Op, at which point was tolerating a regular solid diet, have adequate pain control with P.O. pain medication, and could ambulate without difficulty. The JP was checked for Cr and was removed after a negative study. The patient will follow up with [place] for post op check.   Condition at Discharge: Improved  Discharge Medications:  Allergies as of 12/01/2016   No Known Allergies     Medication List    TAKE these medications   atorvastatin 10 MG tablet Commonly known as:  LIPITOR Take 10 mg by mouth every evening.   cetirizine 10 MG tablet Commonly known as:  ZYRTEC Take 10 mg by mouth daily.   fenofibrate 160 MG tablet Take 160 mg by mouth every evening.   HYDROcodone-acetaminophen 5-325 MG tablet Commonly known as:  NORCO Take 1-2 tablets by mouth every 6 (six) hours as needed for moderate pain or severe pain.   sulfamethoxazole-trimethoprim 800-160 MG tablet Commonly known as:  BACTRIM DS,SEPTRA DS Take 1 tablet by mouth 2 (two)  times daily. Start the day prior to foley removal appointment        I have seen and examined the pateint and agree with Dr. Mariel Craft' assesment and plan. All DC goals met.

## 2016-12-01 NOTE — Care Management Note (Signed)
Case Management Note  Patient Details  Name: Johnny Burns MRN: [id number] Date of Birth: October 13, 1960  Subjective/Objective:   56 y/o m admitted w3/Prostate Ca. From home.                 Action/Plan:d/c home.   Expected Discharge Date:  12/01/16               Expected Discharge Plan:  Home/Self Care  In-House Referral:     Discharge planning Services  CM Consult  Post Acute Care Choice:    Choice offered to:     DME Arranged:    DME Agency:     HH Arranged:    HH Agency:     Status of Service:  Completed, signed off  If discussed at [employer] of Stay Meetings, dates discussed:    Additional Comments:  Dessa Phi, RN 12/01/2016, 12:52 PM

## 2016-12-08 DIAGNOSIS — C61 Malignant neoplasm of prostate: Secondary | ICD-10-CM | POA: Diagnosis not present

## 2016-12-30 DIAGNOSIS — M6281 Muscle weakness (generalized): Secondary | ICD-10-CM | POA: Diagnosis not present

## 2016-12-30 DIAGNOSIS — M62838 Other muscle spasm: Secondary | ICD-10-CM | POA: Diagnosis not present

## 2017-03-01 DIAGNOSIS — C61 Malignant neoplasm of prostate: Secondary | ICD-10-CM | POA: Diagnosis not present

## 2017-03-06 DIAGNOSIS — C61 Malignant neoplasm of prostate: Secondary | ICD-10-CM | POA: Diagnosis not present

## 2017-05-29 DIAGNOSIS — C61 Malignant neoplasm of prostate: Secondary | ICD-10-CM | POA: Diagnosis not present

## 2017-06-05 DIAGNOSIS — N183 Chronic kidney disease, stage 3 (moderate): Secondary | ICD-10-CM | POA: Diagnosis not present

## 2017-06-05 DIAGNOSIS — C61 Malignant neoplasm of prostate: Secondary | ICD-10-CM | POA: Diagnosis not present

## 2017-06-06 ENCOUNTER — Encounter: Payer: Self-pay | Admitting: Radiation Oncology

## 2017-06-19 ENCOUNTER — Encounter: Payer: Self-pay | Admitting: Medical Oncology

## 2017-06-19 ENCOUNTER — Ambulatory Visit
Admission: RE | Admit: 2017-06-19 | Discharge: 2017-06-19 | Disposition: A | Discharge status: Home / Self Care | Payer: 59 | Source: Ambulatory Visit | Attending: Radiation Oncology | Admitting: Radiation Oncology

## 2017-06-19 ENCOUNTER — Encounter: Payer: Self-pay | Admitting: Radiation Oncology

## 2017-06-19 VITALS — BP 140/83 | HR 68 | Temp 97.7°F | Resp 16 | Ht 70.0 in | Wt 189.4 lb

## 2017-06-19 DIAGNOSIS — N183 Chronic kidney disease, stage 3 (moderate): Secondary | ICD-10-CM | POA: Insufficient documentation

## 2017-06-19 DIAGNOSIS — Z801 Family history of malignant neoplasm of trachea, bronchus and lung: Secondary | ICD-10-CM | POA: Insufficient documentation

## 2017-06-19 DIAGNOSIS — Z79899 Other long term (current) drug therapy: Secondary | ICD-10-CM | POA: Diagnosis not present

## 2017-06-19 DIAGNOSIS — R9721 Rising PSA following treatment for malignant neoplasm of prostate: Secondary | ICD-10-CM | POA: Diagnosis not present

## 2017-06-19 DIAGNOSIS — C61 Malignant neoplasm of prostate: Secondary | ICD-10-CM

## 2017-06-19 DIAGNOSIS — Z87442 Personal history of urinary calculi: Secondary | ICD-10-CM | POA: Insufficient documentation

## 2017-06-19 DIAGNOSIS — E785 Hyperlipidemia, unspecified: Secondary | ICD-10-CM | POA: Diagnosis not present

## 2017-06-19 HISTORY — DX: Malignant neoplasm of prostate: C61

## 2017-06-19 NOTE — Progress Notes (Signed)
      GU Location of Tumor / Histology: Biochemical Recurrent Prostatic Adenocarcinoma  If Prostate Cancer, Gleason Score is (4 + 5) and PSA is (5.2)  05/29/2017         PSA         0.027 03/01/2017         PSA         0.015   Past/Anticipated interventions by urology:  10/08/2012: Left EXTRACORPOREAL SHOCK WAVE LITHOTRIPSY  11/30/2012: Left CYSTOSCOPY WITH RETROGRADE PYELOGRAM, URETEROSCOPY AND STENT PLACEMENT  11/30/2016: XI ROBOTIC ASSISTED LAPAROSCOPIC RADICAL PROSTATECTOMY (N/A) PELVIC LYMPHADENECTOMY (Bilateral) CYSTOSCOPY WITH INJECTION OF INDOCYANINE GREEN DYE   Past/Anticipated interventions by medical oncology, if any: None  Weight changes, if any: None  Bowel/Bladder complaints, if any:  Denies dysuria or hematuria. Reports rare scant leakage only with strenuous activity I-PSS: 4  Nausea/Vomiting, if any: No  Pain issues, if any:  No  SAFETY ISSUES:  Prior radiation? No  Pacemaker/ICD? No  Possible current pregnancy? No  Is the patient on methotrexate? No  Current Complaints / other details:  57 year old male. Married. Two children. Two grandchildren and one on the way. Father with hx of throat ca. Resides in [place]. Runs a lawn care business.

## 2017-06-19 NOTE — Progress Notes (Signed)
See progress note under physician encounter. 

## 2017-06-19 NOTE — Progress Notes (Addendum)
Radiation Oncology         [phone] ________________________________  Initial Outpatient Consultation  Name: [name] MRN: [id number]  Date: 06/19/2017  DOB: [birth year]  XI:[id number], Johnny Saxon, MD  Alexis Frock, MD   REFERRING PHYSICIAN: Alexis Frock, MD  DIAGNOSIS: 57 y.o. gentleman with Stage pT3bN0MX adenocarcinoma of the prostate with Gleason Score of 4+5, and rising PSA of 0.027.    ICD-10-CM   1. Malignant neoplasm of prostate ([place]) C61   2. Prostate cancer [facility]) C61     HISTORY OF PRESENT ILLNESS: Johnny Burns is a 57 y.o. male with a diagnosis of prostate cancer. He was noted to have an elevated PSA of 5.2 in March 2018 by his primary care physician, Dr. Ernestine Conrad.  Accordingly, he was referred for evaluation in urology to Dr. Tresa Moore.  The patient proceeded to transrectal ultrasound with 12 biopsies of the prostate on 10/06/2016.  Out of 12 core biopsies, 10 were positive.  The maximum Gleason score was 4+5, and this was seen in right base, right mid, and right base lateral.  Staging studies include CT and bone scans performed on 10/18/2016 that showed localized disease.   He subsequently underwent robotic prostatectomy with bilateral pelvic lymphadenectomy on 11/30/2016. Pathology revealed pT3bN0, Gleason 4+5 prostatic adenocarcinoma with extraprostatic extension, seminal vesicle involvement, and a focal positive margin at the right seminal vesicle.  All other margins were negative for carcinoma.  His post-operative PSA was 0.015 in January 2019 and 0.027 in April 2019.  The patient reviewed these results with his urologist and he has kindly been referred today for discussion of adjuvant radiation treatment options.   PREVIOUS RADIATION THERAPY: No  PAST MEDICAL HISTORY:  Past Medical History:  Diagnosis Date  . Chronic kidney disease    Stage 3 sees primary physcian  . History of kidney stones   . Hyperlipidemia   . Left ureteral calculus     . Prostate cancer ([place])       PAST SURGICAL HISTORY: Past Surgical History:  Procedure Laterality Date  . CYSTOSCOPY WITH RETROGRADE PYELOGRAM, URETEROSCOPY AND STENT PLACEMENT Left 11/30/2012   Procedure: CYSTOSCOPY WITH LEFT RETROGRADE PYELOGRAM, LEFT URETEROSCOPY LEFT STONE EXTRACTION WITH BASKET  AND LEFT STENT PLACEMENT;  Surgeon: Alexis Frock, MD;  Location: [facility];  Service: Urology;  Laterality: Left;  . EXTRACORPOREAL SHOCK WAVE LITHOTRIPSY Left 10-08-2012  . INGUINAL HERNIA REPAIR Right 1990  . LYMPHADENECTOMY Bilateral 11/30/2016   Procedure: PELVIC LYMPHADENECTOMY;  Surgeon: Alexis Frock, MD;  Location: [facility];  Service: Urology;  Laterality: Bilateral;  . ROBOT ASSISTED LAPAROSCOPIC RADICAL PROSTATECTOMY N/A 11/30/2016   Procedure: XI ROBOTIC ASSISTED LAPAROSCOPIC RADICAL PROSTATECTOMY;  Surgeon: Alexis Frock, MD;  Location: [facility];  Service: Urology;  Laterality: N/A;  . XI ROBOTIC ASSISTED SIMPLE PROSTATECTOMY     Dr. Tresa Moore 11/30/16    FAMILY HISTORY:  Family History  Problem Relation Age of Onset  . Cancer Father        throat  . Hypertension Brother     SOCIAL HISTORY:  Social History   Socioeconomic History  . Marital status: Married    Spouse name: Not on file  . Number of children: 2  . Years of education: Not on file  . Highest education level: Not on file  Occupational History    Comment: lawn care  Social Needs  . Financial resource strain: Not on file  . Food insecurity:    Worry: Not on file    Inability:  Not on file  . Transportation needs:    Medical: Not on file    Non-medical: Not on file  Tobacco Use  . Smoking status: Never Smoker  . Smokeless tobacco: Never Used  Substance and Sexual Activity  . Alcohol use: No  . Drug use: No  . Sexual activity: Yes  Lifestyle  . Physical activity:    Days per week: Not on file    Minutes per session: Not on file  . Stress: Not on file  Relationships  . Social  connections:    Talks on phone: Not on file    Gets together: Not on file    Attends religious service: Not on file    Active member of club or organization: Not on file    Attends meetings of clubs or organizations: Not on file    Relationship status: Not on file  . Intimate partner violence:    Fear of current or ex partner: Not on file    Emotionally abused: Not on file    Physically abused: Not on file    Forced sexual activity: Not on file  Other Topics Concern  . Not on file  Social History Narrative   Married with two children. Two grandchildren and one on the way. Resides in [place]. 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    ALLERGIES: Patient has no known allergies.  MEDICATIONS:  Current Outpatient Medications  Medication Sig Dispense Refill  . atorvastatin (LIPITOR) 10 MG tablet Take 10 mg by mouth every evening.    . cetirizine (ZYRTEC) 10 MG tablet Take 10 mg by mouth daily.    . fenofibrate 160 MG tablet Take 160 mg by mouth every evening.     No current facility-administered medications for this encounter.     REVIEW OF SYSTEMS:  On review of systems, the patient reports that he is doing well overall. He denies any chest pain, shortness of breath, cough, fevers, chills, night sweats, or unintended weight changes. He denies any bowel disturbances, and denies abdominal pain, nausea or vomiting. He denies any new musculoskeletal or joint aches or pains. His IPSS was 4, indicating mild urinary symptoms of urgency and incomplete emptying. He has regained continence with rare episodes of leakage and some dribbling. He is not sexually active. A complete review of systems is obtained and is otherwise negative.    PHYSICAL EXAM:  Wt Readings from Last 3 Encounters:  06/19/17 189 lb 6.4 oz (85.9 kg)  11/30/16 182 lb (82.6 kg)  11/21/16 182 lb (82.6 kg)   Temp Readings from Last 3 Encounters:  06/19/17 97.7 F (36.5 C) (Oral)  12/01/16 98.2 F (36.8 C)  (Axillary)  11/21/16 98.2 F (36.8 C) (Oral)   BP Readings from Last 3 Encounters:  06/19/17 140/83  12/01/16 105/71  11/21/16 138/85   Pulse Readings from Last 3 Encounters:  06/19/17 68  12/01/16 72  11/21/16 77   Pain Assessment Pain Score: 0-No pain/10  In general this is a well appearing caucasian male in no acute distress. He is alert and oriented x4 and appropriate throughout the examination. HEENT reveals that the patient is normocephalic, atraumatic. EOMs are intact. PERRLA. Skin is intact without any evidence of gross lesions. Cardiovascular exam reveals a regular rate and rhythm, no clicks rubs or murmurs are auscultated. Chest is clear to auscultation bilaterally. Lymphatic assessment is performed and does not reveal any adenopathy in the cervical, supraclavicular, axillary, or inguinal chains.    KPS = 100  100 - Normal; no complaints; no evidence of disease. 90   - Able to carry on normal activity; minor signs or symptoms of disease. 80   - Normal activity with effort; some signs or symptoms of disease. 5   - Cares for self; unable to carry on normal activity or to do active work. 60   - Requires occasional assistance, but is able to care for most of his personal needs. 50   - Requires considerable assistance and frequent medical care. 35   - Disabled; requires special care and assistance. 74   - Severely disabled; hospital admission is indicated although death not imminent. 55   - Very sick; hospital admission necessary; active supportive treatment necessary. 10   - Moribund; fatal processes progressing rapidly. 0     - Dead  Karnofsky DA, Abelmann [place], Craver LS and [facility] [phone]) The use of the nitrogen mustards in the palliative treatment of carcinoma: with particular reference to bronchogenic carcinoma Cancer 1 634-56  LABORATORY DATA:  Lab Results  Component Value Date   WBC 6.3 11/21/2016   HGB 12.8 (L) 12/01/2016   HCT 36.2 (L) 12/01/2016   MCV  90.3 11/21/2016   PLT 248 11/21/2016   Lab Results  Component Value Date   NA 136 12/01/2016   K 4.4 12/01/2016   CL 108 12/01/2016   CO2 23 12/01/2016   Lab Results  Component Value Date   ALT 27 10/02/2012   AST 24 10/02/2012   ALKPHOS 78 10/02/2012   BILITOT 0.7 10/02/2012     RADIOGRAPHY: No results found.    IMPRESSION/PLAN: 1. 57 y.o. gentleman with Stage pT3bN0MX adenocarcinoma of the prostate with Gleason Score of 4+5, and post-operative PSA of 0.027. Today we reviewed the findings and workup thus far.  We discussed the natural history of prostate cancer, and the implications of positive margins, extracapsular extension, and seminal vesicle involvement on the risk of prostate cancer recurrence. We reviewed some of the evidence suggesting an advantage for patients who undergo adjuvant radiotherapy in the setting in terms of disease control and overall survival. We discussed radiation treatment directed to the prostatic fossa with regard to the logistics and delivery of external beam radiation treatment. We also detailed the role of ADT in the treatment of high risk prostate cancer and outlined the associated side effects that could be expected with this therapy. The patient would like to proceed with adjuvant radiotherapy to the prostatic fossa, but declines adding ADT at this time.  He is scheduled for CT simulation on Friday April 26th at 8:00 AM.  We spent more than 50% of today's time face to face with the patient in counseling and/or coordination of care.     Carola Rhine, PAC    Tyler Pita, MD  [facility] Direct Dial: [phone]  Fax: [phone] [email or web address]  Skype  [email or web address]   This document serves as a record of services personally performed by Tyler Pita, MD and Shona Simpson, PA-C. It was created on their behalf by Rae Lips, a trained medical scribe. The creation of this record is based on the scribe's personal  observations and the providers' statements to them. This document has been checked and approved by the attending providers.

## 2017-06-23 ENCOUNTER — Encounter: Payer: Self-pay | Admitting: Medical Oncology

## 2017-06-23 ENCOUNTER — Ambulatory Visit
Admission: RE | Admit: 2017-06-23 | Discharge: 2017-06-23 | Disposition: A | Discharge status: Home / Self Care | Payer: 59 | Source: Ambulatory Visit | Attending: Radiation Oncology | Admitting: Radiation Oncology

## 2017-06-23 DIAGNOSIS — Z510 Encounter for antineoplastic radiation therapy: Secondary | ICD-10-CM | POA: Diagnosis not present

## 2017-06-23 DIAGNOSIS — C61 Malignant neoplasm of prostate: Secondary | ICD-10-CM | POA: Insufficient documentation

## 2017-06-23 NOTE — Progress Notes (Signed)
Introduced myself to Johnny Burns and his wife as the prostate nurse navigator. I was unable to meet him the day he consulted with Dr. Tammi Klippel. He underwent a robotic prostatectomy 11/30/17 and now with a rising PSA. He will start radiation 07/03/17. I gave him my business card and asked them to call with questions or concerns.

## 2017-06-23 NOTE — Progress Notes (Signed)
  Radiation Oncology         [phone] ________________________________  Name: Johnny Burns MRN: [id number]  Date: 06/23/2017  DOB: [birth year]  SIMULATION AND TREATMENT PLANNING NOTE    ICD-10-CM   1. Prostate cancer ([place]) C61     DIAGNOSIS:  57 y.o. gentleman with Stage pT3bN0MX adenocarcinoma of the prostate with Gleason Score of 4+5, and rising PSA of 0.027.  NARRATIVE:  The patient was brought to the [place].  Identity was confirmed.  All relevant records and images related to the planned course of therapy were reviewed.  The patient freely provided informed written consent to proceed with treatment after reviewing the details related to the planned course of therapy. The consent form was witnessed and verified by the simulation staff.  Then, the patient was set-up in a stable reproducible supine position for radiation therapy.  A vacuum lock pillow device was custom fabricated to position his legs in a reproducible immobilized position.  Then, I performed a urethrogram under sterile conditions to identify the prostatic apex.  CT images were obtained.  Surface markings were placed.  The CT images were loaded into the planning software.  Then the prostate target and avoidance structures including the rectum, bladder, bowel and hips were contoured.  Treatment planning then occurred.  The radiation prescription was entered and confirmed.  A total of one complex treatment devices were fabricated. I have requested : Intensity Modulated Radiotherapy (IMRT) is medically necessary for this case for the following reason:  Rectal sparing.Marland Kitchen  PLAN:  The patient will receive 45 Gy in 25 fractions of 1.8 Gy, followed by a boost to the prostate to a total dose of 68.4 Gy with 13 additional fractions of 2.0 Gy.  ________________________________  Sheral Apley Tammi Klippel, M.D.  This document serves as a record of services personally performed by Tyler Pita, MD. It was created on  his behalf by Arlyce Harman, a trained medical scribe. The creation of this record is based on the scribe's personal observations and the provider's statements to them. This document has been checked and approved by the attending provider.

## 2017-06-30 DIAGNOSIS — C61 Malignant neoplasm of prostate: Secondary | ICD-10-CM | POA: Diagnosis not present

## 2017-07-03 ENCOUNTER — Ambulatory Visit
Admission: RE | Admit: 2017-07-03 | Discharge: 2017-07-03 | Disposition: A | Discharge status: Home / Self Care | Payer: 59 | Source: Ambulatory Visit | Attending: Radiation Oncology | Admitting: Radiation Oncology

## 2017-07-03 DIAGNOSIS — C61 Malignant neoplasm of prostate: Secondary | ICD-10-CM | POA: Diagnosis not present

## 2017-07-04 ENCOUNTER — Ambulatory Visit
Admission: RE | Admit: 2017-07-04 | Discharge: 2017-07-04 | Disposition: A | Discharge status: Home / Self Care | Payer: 59 | Source: Ambulatory Visit | Attending: Radiation Oncology | Admitting: Radiation Oncology

## 2017-07-04 DIAGNOSIS — C61 Malignant neoplasm of prostate: Secondary | ICD-10-CM | POA: Diagnosis not present

## 2017-07-05 ENCOUNTER — Ambulatory Visit
Admission: RE | Admit: 2017-07-05 | Discharge: 2017-07-05 | Disposition: A | Discharge status: Home / Self Care | Payer: 59 | Source: Ambulatory Visit | Attending: Radiation Oncology | Admitting: Radiation Oncology

## 2017-07-05 DIAGNOSIS — C61 Malignant neoplasm of prostate: Secondary | ICD-10-CM | POA: Diagnosis not present

## 2017-07-06 ENCOUNTER — Ambulatory Visit
Admission: RE | Admit: 2017-07-06 | Discharge: 2017-07-06 | Disposition: A | Discharge status: Home / Self Care | Payer: 59 | Source: Ambulatory Visit | Attending: Radiation Oncology | Admitting: Radiation Oncology

## 2017-07-06 DIAGNOSIS — C61 Malignant neoplasm of prostate: Secondary | ICD-10-CM | POA: Diagnosis not present

## 2017-07-07 ENCOUNTER — Ambulatory Visit
Admission: RE | Admit: 2017-07-07 | Discharge: 2017-07-07 | Disposition: A | Discharge status: Home / Self Care | Payer: 59 | Source: Ambulatory Visit | Attending: Radiation Oncology | Admitting: Radiation Oncology

## 2017-07-07 DIAGNOSIS — C61 Malignant neoplasm of prostate: Secondary | ICD-10-CM | POA: Diagnosis not present

## 2017-07-10 ENCOUNTER — Ambulatory Visit
Admission: RE | Admit: 2017-07-10 | Discharge: 2017-07-10 | Disposition: A | Discharge status: Home / Self Care | Payer: 59 | Source: Ambulatory Visit | Attending: Radiation Oncology | Admitting: Radiation Oncology

## 2017-07-10 DIAGNOSIS — C61 Malignant neoplasm of prostate: Secondary | ICD-10-CM | POA: Diagnosis not present

## 2017-07-11 ENCOUNTER — Ambulatory Visit
Admission: RE | Admit: 2017-07-11 | Discharge: 2017-07-11 | Disposition: A | Discharge status: Home / Self Care | Payer: 59 | Source: Ambulatory Visit | Attending: Radiation Oncology | Admitting: Radiation Oncology

## 2017-07-11 DIAGNOSIS — C61 Malignant neoplasm of prostate: Secondary | ICD-10-CM | POA: Diagnosis not present

## 2017-07-12 ENCOUNTER — Ambulatory Visit
Admission: RE | Admit: 2017-07-12 | Discharge: 2017-07-12 | Disposition: A | Discharge status: Home / Self Care | Payer: 59 | Source: Ambulatory Visit | Attending: Radiation Oncology | Admitting: Radiation Oncology

## 2017-07-12 DIAGNOSIS — C61 Malignant neoplasm of prostate: Secondary | ICD-10-CM | POA: Diagnosis not present

## 2017-07-13 ENCOUNTER — Ambulatory Visit
Admission: RE | Admit: 2017-07-13 | Discharge: 2017-07-13 | Disposition: A | Discharge status: Home / Self Care | Payer: 59 | Source: Ambulatory Visit | Attending: Radiation Oncology | Admitting: Radiation Oncology

## 2017-07-13 DIAGNOSIS — C61 Malignant neoplasm of prostate: Secondary | ICD-10-CM | POA: Diagnosis not present

## 2017-07-14 ENCOUNTER — Ambulatory Visit
Admission: RE | Admit: 2017-07-14 | Discharge: 2017-07-14 | Disposition: A | Discharge status: Home / Self Care | Payer: 59 | Source: Ambulatory Visit | Attending: Radiation Oncology | Admitting: Radiation Oncology

## 2017-07-14 DIAGNOSIS — C61 Malignant neoplasm of prostate: Secondary | ICD-10-CM | POA: Diagnosis not present

## 2017-07-17 ENCOUNTER — Ambulatory Visit
Admission: RE | Admit: 2017-07-17 | Discharge: 2017-07-17 | Disposition: A | Discharge status: Home / Self Care | Payer: 59 | Source: Ambulatory Visit | Attending: Radiation Oncology | Admitting: Radiation Oncology

## 2017-07-17 DIAGNOSIS — C61 Malignant neoplasm of prostate: Secondary | ICD-10-CM | POA: Diagnosis not present

## 2017-07-18 ENCOUNTER — Ambulatory Visit
Admission: RE | Admit: 2017-07-18 | Discharge: 2017-07-18 | Disposition: A | Discharge status: Home / Self Care | Payer: 59 | Source: Ambulatory Visit | Attending: Radiation Oncology | Admitting: Radiation Oncology

## 2017-07-18 DIAGNOSIS — C61 Malignant neoplasm of prostate: Secondary | ICD-10-CM | POA: Diagnosis not present

## 2017-07-19 ENCOUNTER — Ambulatory Visit
Admission: RE | Admit: 2017-07-19 | Discharge: 2017-07-19 | Disposition: A | Discharge status: Home / Self Care | Payer: 59 | Source: Ambulatory Visit | Attending: Radiation Oncology | Admitting: Radiation Oncology

## 2017-07-19 DIAGNOSIS — C61 Malignant neoplasm of prostate: Secondary | ICD-10-CM | POA: Diagnosis not present

## 2017-07-20 ENCOUNTER — Encounter: Payer: Self-pay | Admitting: Urology

## 2017-07-20 ENCOUNTER — Other Ambulatory Visit: Payer: Self-pay | Admitting: Urology

## 2017-07-20 ENCOUNTER — Ambulatory Visit
Admission: RE | Admit: 2017-07-20 | Discharge: 2017-07-20 | Disposition: A | Discharge status: Home / Self Care | Payer: 59 | Source: Ambulatory Visit | Attending: Radiation Oncology | Admitting: Radiation Oncology

## 2017-07-20 DIAGNOSIS — B372 Candidiasis of skin and nail: Secondary | ICD-10-CM | POA: Insufficient documentation

## 2017-07-20 DIAGNOSIS — C61 Malignant neoplasm of prostate: Secondary | ICD-10-CM | POA: Diagnosis not present

## 2017-07-20 MED ORDER — NYSTATIN-TRIAMCINOLONE 100000-0.1 UNIT/GM-% EX CREA: 1.0000 "application " | TOPICAL_CREAM | Freq: Two times a day (BID) | CUTANEOUS | 0 refills | Status: DC

## 2017-07-20 NOTE — Progress Notes (Signed)
The patient presented to the clinic following his regularly scheduled radiotherapy treatment today complaining of a rash in his bilateral groin.  This has been present for the past 2 days and appears to be progressively worsening.  He denies pruritus but reports stinging/burning.  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  He has not had similar problems previously.  On exam, he has diffuse satellite lesions along the lower abdomen and into the groin bilaterally.  The lesions are erythematous but there is no associated discharge or bleeding.  There are no further lesions elsewhere on the body.  This appears to be a yeast dermatitis.  I advised that I will send a prescription for Mycostatin cream to be used twice daily.  He knows to call the office should the rash persist or worsen despite treatment.  I also advised not to apply the cream within 4 hours of radiation treatments.  He will apply first thing in the morning and shower prior to his scheduled treatments and then reapply in the evening after his treatment.    Nicholos Johns, PA-C

## 2017-07-21 ENCOUNTER — Other Ambulatory Visit: Payer: Self-pay | Admitting: Urology

## 2017-07-21 ENCOUNTER — Ambulatory Visit
Admission: RE | Admit: 2017-07-21 | Discharge: 2017-07-21 | Disposition: A | Discharge status: Home / Self Care | Payer: 59 | Source: Ambulatory Visit | Attending: Radiation Oncology | Admitting: Radiation Oncology

## 2017-07-21 DIAGNOSIS — C61 Malignant neoplasm of prostate: Secondary | ICD-10-CM | POA: Diagnosis not present

## 2017-07-21 MED ORDER — NYSTATIN 100000 UNIT/GM EX CREA: 1.0000 "application " | TOPICAL_CREAM | Freq: Two times a day (BID) | CUTANEOUS | 0 refills | Status: DC

## 2017-07-25 ENCOUNTER — Ambulatory Visit
Admission: RE | Admit: 2017-07-25 | Discharge: 2017-07-25 | Disposition: A | Discharge status: Home / Self Care | Payer: 59 | Source: Ambulatory Visit | Attending: Radiation Oncology | Admitting: Radiation Oncology

## 2017-07-25 DIAGNOSIS — C61 Malignant neoplasm of prostate: Secondary | ICD-10-CM | POA: Diagnosis not present

## 2017-07-26 ENCOUNTER — Ambulatory Visit
Admission: RE | Admit: 2017-07-26 | Discharge: 2017-07-26 | Disposition: A | Discharge status: Home / Self Care | Payer: 59 | Source: Ambulatory Visit | Attending: Radiation Oncology | Admitting: Radiation Oncology

## 2017-07-26 DIAGNOSIS — C61 Malignant neoplasm of prostate: Secondary | ICD-10-CM | POA: Diagnosis not present

## 2017-07-27 ENCOUNTER — Ambulatory Visit
Admission: RE | Admit: 2017-07-27 | Discharge: 2017-07-27 | Disposition: A | Discharge status: Home / Self Care | Payer: 59 | Source: Ambulatory Visit | Attending: Radiation Oncology | Admitting: Radiation Oncology

## 2017-07-27 DIAGNOSIS — C61 Malignant neoplasm of prostate: Secondary | ICD-10-CM | POA: Diagnosis not present

## 2017-07-28 ENCOUNTER — Ambulatory Visit
Admission: RE | Admit: 2017-07-28 | Discharge: 2017-07-28 | Disposition: A | Discharge status: Home / Self Care | Payer: 59 | Source: Ambulatory Visit | Attending: Radiation Oncology | Admitting: Radiation Oncology

## 2017-07-28 DIAGNOSIS — C61 Malignant neoplasm of prostate: Secondary | ICD-10-CM | POA: Diagnosis not present

## 2017-07-31 ENCOUNTER — Ambulatory Visit
Admission: RE | Admit: 2017-07-31 | Discharge: 2017-07-31 | Disposition: A | Discharge status: Home / Self Care | Payer: 59 | Source: Ambulatory Visit | Attending: Radiation Oncology | Admitting: Radiation Oncology

## 2017-07-31 DIAGNOSIS — Z510 Encounter for antineoplastic radiation therapy: Secondary | ICD-10-CM | POA: Diagnosis present

## 2017-07-31 DIAGNOSIS — C61 Malignant neoplasm of prostate: Secondary | ICD-10-CM | POA: Insufficient documentation

## 2017-08-01 ENCOUNTER — Ambulatory Visit
Admission: RE | Admit: 2017-08-01 | Discharge: 2017-08-01 | Disposition: A | Discharge status: Home / Self Care | Payer: 59 | Source: Ambulatory Visit | Attending: Radiation Oncology | Admitting: Radiation Oncology

## 2017-08-01 DIAGNOSIS — Z510 Encounter for antineoplastic radiation therapy: Secondary | ICD-10-CM | POA: Diagnosis not present

## 2017-08-02 ENCOUNTER — Telehealth: Payer: Self-pay | Admitting: Radiation Oncology

## 2017-08-02 ENCOUNTER — Ambulatory Visit
Admission: RE | Admit: 2017-08-02 | Discharge: 2017-08-02 | Disposition: A | Discharge status: Home / Self Care | Payer: 59 | Source: Ambulatory Visit | Attending: Radiation Oncology | Admitting: Radiation Oncology

## 2017-08-02 ENCOUNTER — Other Ambulatory Visit: Payer: Self-pay | Admitting: Radiation Oncology

## 2017-08-02 DIAGNOSIS — Z510 Encounter for antineoplastic radiation therapy: Secondary | ICD-10-CM | POA: Diagnosis not present

## 2017-08-02 MED ORDER — NYSTATIN 100000 UNIT/GM EX CREA: 1.0000 "application " | TOPICAL_CREAM | Freq: Two times a day (BID) | CUTANEOUS | 0 refills | Status: DC

## 2017-08-02 NOTE — Telephone Encounter (Signed)
Phoned patient's cell. No answer. Left message that the refill he requested has been escribed to [facility], [place]. Left my contact information for any future questions.

## 2017-08-03 ENCOUNTER — Ambulatory Visit
Admission: RE | Admit: 2017-08-03 | Discharge: 2017-08-03 | Disposition: A | Discharge status: Home / Self Care | Payer: 59 | Source: Ambulatory Visit | Attending: Radiation Oncology | Admitting: Radiation Oncology

## 2017-08-03 DIAGNOSIS — Z510 Encounter for antineoplastic radiation therapy: Secondary | ICD-10-CM | POA: Diagnosis not present

## 2017-08-04 ENCOUNTER — Encounter: Payer: Self-pay | Admitting: Medical Oncology

## 2017-08-04 ENCOUNTER — Ambulatory Visit
Admission: RE | Admit: 2017-08-04 | Discharge: 2017-08-04 | Disposition: A | Discharge status: Home / Self Care | Payer: 59 | Source: Ambulatory Visit | Attending: Radiation Oncology | Admitting: Radiation Oncology

## 2017-08-04 DIAGNOSIS — Z510 Encounter for antineoplastic radiation therapy: Secondary | ICD-10-CM | POA: Diagnosis not present

## 2017-08-04 NOTE — Progress Notes (Signed)
Johnny Burns states he is doing well with his radiation. His main issue has been the yeast dermatitis but the cream has helped. He states he has 15 treatments left and is looking forward to finishing.

## 2017-08-07 ENCOUNTER — Ambulatory Visit
Admission: RE | Admit: 2017-08-07 | Discharge: 2017-08-07 | Disposition: A | Discharge status: Home / Self Care | Payer: 59 | Source: Ambulatory Visit | Attending: Radiation Oncology | Admitting: Radiation Oncology

## 2017-08-07 DIAGNOSIS — Z510 Encounter for antineoplastic radiation therapy: Secondary | ICD-10-CM | POA: Diagnosis not present

## 2017-08-08 ENCOUNTER — Ambulatory Visit
Admission: RE | Admit: 2017-08-08 | Discharge: 2017-08-08 | Disposition: A | Discharge status: Home / Self Care | Payer: 59 | Source: Ambulatory Visit | Attending: Radiation Oncology | Admitting: Radiation Oncology

## 2017-08-08 DIAGNOSIS — Z510 Encounter for antineoplastic radiation therapy: Secondary | ICD-10-CM | POA: Diagnosis not present

## 2017-08-09 ENCOUNTER — Ambulatory Visit
Admission: RE | Admit: 2017-08-09 | Discharge: 2017-08-09 | Disposition: A | Discharge status: Home / Self Care | Payer: 59 | Source: Ambulatory Visit | Attending: Radiation Oncology | Admitting: Radiation Oncology

## 2017-08-09 DIAGNOSIS — Z510 Encounter for antineoplastic radiation therapy: Secondary | ICD-10-CM | POA: Diagnosis not present

## 2017-08-10 ENCOUNTER — Ambulatory Visit
Admission: RE | Admit: 2017-08-10 | Discharge: 2017-08-10 | Disposition: A | Discharge status: Home / Self Care | Payer: 59 | Source: Ambulatory Visit | Attending: Radiation Oncology | Admitting: Radiation Oncology

## 2017-08-10 DIAGNOSIS — Z510 Encounter for antineoplastic radiation therapy: Secondary | ICD-10-CM | POA: Diagnosis not present

## 2017-08-11 ENCOUNTER — Ambulatory Visit
Admission: RE | Admit: 2017-08-11 | Discharge: 2017-08-11 | Disposition: A | Discharge status: Home / Self Care | Payer: 59 | Source: Ambulatory Visit | Attending: Radiation Oncology | Admitting: Radiation Oncology

## 2017-08-11 DIAGNOSIS — Z510 Encounter for antineoplastic radiation therapy: Secondary | ICD-10-CM | POA: Diagnosis not present

## 2017-08-14 ENCOUNTER — Ambulatory Visit
Admission: RE | Admit: 2017-08-14 | Discharge: 2017-08-14 | Disposition: A | Discharge status: Home / Self Care | Payer: 59 | Source: Ambulatory Visit | Attending: Radiation Oncology | Admitting: Radiation Oncology

## 2017-08-14 DIAGNOSIS — Z510 Encounter for antineoplastic radiation therapy: Secondary | ICD-10-CM | POA: Diagnosis not present

## 2017-08-15 ENCOUNTER — Ambulatory Visit
Admission: RE | Admit: 2017-08-15 | Discharge: 2017-08-15 | Disposition: A | Discharge status: Home / Self Care | Payer: 59 | Source: Ambulatory Visit | Attending: Radiation Oncology | Admitting: Radiation Oncology

## 2017-08-15 DIAGNOSIS — Z510 Encounter for antineoplastic radiation therapy: Secondary | ICD-10-CM | POA: Diagnosis not present

## 2017-08-16 ENCOUNTER — Ambulatory Visit
Admission: RE | Admit: 2017-08-16 | Discharge: 2017-08-16 | Disposition: A | Discharge status: Home / Self Care | Payer: 59 | Source: Ambulatory Visit | Attending: Radiation Oncology | Admitting: Radiation Oncology

## 2017-08-16 DIAGNOSIS — Z510 Encounter for antineoplastic radiation therapy: Secondary | ICD-10-CM | POA: Diagnosis not present

## 2017-08-17 ENCOUNTER — Ambulatory Visit
Admission: RE | Admit: 2017-08-17 | Discharge: 2017-08-17 | Disposition: A | Discharge status: Home / Self Care | Payer: 59 | Source: Ambulatory Visit | Attending: Radiation Oncology | Admitting: Radiation Oncology

## 2017-08-17 DIAGNOSIS — Z510 Encounter for antineoplastic radiation therapy: Secondary | ICD-10-CM | POA: Diagnosis not present

## 2017-08-18 ENCOUNTER — Ambulatory Visit
Admission: RE | Admit: 2017-08-18 | Discharge: 2017-08-18 | Disposition: A | Discharge status: Home / Self Care | Payer: 59 | Source: Ambulatory Visit | Attending: Radiation Oncology | Admitting: Radiation Oncology

## 2017-08-18 DIAGNOSIS — Z510 Encounter for antineoplastic radiation therapy: Secondary | ICD-10-CM | POA: Diagnosis not present

## 2017-08-21 ENCOUNTER — Ambulatory Visit
Admission: RE | Admit: 2017-08-21 | Discharge: 2017-08-21 | Disposition: A | Discharge status: Home / Self Care | Payer: 59 | Source: Ambulatory Visit | Attending: Radiation Oncology | Admitting: Radiation Oncology

## 2017-08-21 DIAGNOSIS — Z510 Encounter for antineoplastic radiation therapy: Secondary | ICD-10-CM | POA: Diagnosis not present

## 2017-08-22 ENCOUNTER — Ambulatory Visit
Admission: RE | Admit: 2017-08-22 | Discharge: 2017-08-22 | Disposition: A | Discharge status: Home / Self Care | Payer: 59 | Source: Ambulatory Visit | Attending: Radiation Oncology | Admitting: Radiation Oncology

## 2017-08-22 DIAGNOSIS — Z510 Encounter for antineoplastic radiation therapy: Secondary | ICD-10-CM | POA: Diagnosis not present

## 2017-08-23 ENCOUNTER — Ambulatory Visit
Admission: RE | Admit: 2017-08-23 | Discharge: 2017-08-23 | Disposition: A | Discharge status: Home / Self Care | Payer: 59 | Source: Ambulatory Visit | Attending: Radiation Oncology | Admitting: Radiation Oncology

## 2017-08-23 DIAGNOSIS — Z510 Encounter for antineoplastic radiation therapy: Secondary | ICD-10-CM | POA: Diagnosis not present

## 2017-08-24 ENCOUNTER — Encounter: Payer: Self-pay | Admitting: Radiation Oncology

## 2017-08-24 ENCOUNTER — Ambulatory Visit
Admission: RE | Admit: 2017-08-24 | Discharge: 2017-08-24 | Disposition: A | Discharge status: Home / Self Care | Payer: 59 | Source: Ambulatory Visit | Attending: Radiation Oncology | Admitting: Radiation Oncology

## 2017-08-24 DIAGNOSIS — Z510 Encounter for antineoplastic radiation therapy: Secondary | ICD-10-CM | POA: Diagnosis not present

## 2017-08-24 NOTE — Progress Notes (Signed)
  Radiation Oncology         [phone] ________________________________  Name: Kemal Amores MRN: [id number]  Date: 08/24/2017  DOB: [birth year]  End of Treatment Note  Diagnosis:   57 y.o.gentleman withStagepT3bN0MXadenocarcinoma of the prostate with Gleason Score of 4+5, andrisingPSA of 0.027.  Indication for treatment:  Curative, Definitive Radiotherapy       Radiation treatment dates:   07/03/2017 to 08/24/2017  Site/dose:    1. The prostate bed was treated to 45 Gy in 25 fractions of 1.8 Gy 2. The prostate bed was boosted to 23.4 Gy in 13 fractions of 1.8 Gy  Beams/energy:   The patient was treated with IMRT using volumetric arc therapy delivering 6 MV X-rays to clockwise and counterclockwise circumferential arcs with a 90 degree collimator offset to avoid dose scalloping.  Image guidance was performed with daily cone beam CT prior to each fraction to align to gold markers in the prostate and assure proper bladder and rectal fill volumes.  Immobilization was achieved with BodyFix custom mold.  Narrative: The patient tolerated radiation treatment relatively well.   He experienced modest fatigue and some minor urinary irritation including urgency.  He reported a steady urine stream with occasional difficulty emptying his bladder completely denied  dysuria, hematuria, urinary leakage, or incontinence.  He did not experience any diarrhea  or abdominopelvic pain.    Plan: The patient has completed radiation treatment. He will return to radiation oncology clinic for routine followup in one month. I advised him to call or return sooner if he has any questions or concerns related to his recovery or treatment. ________________________________  Sheral Apley. Tammi Klippel, M.D.  This document serves as a record of services personally performed by Tyler Pita, MD. It was created on his behalf by Arlyce Harman, a trained medical scribe. The creation of this record is based on the scribe's  personal observations and the provider's statements to them. This document has been checked and approved by the attending provider.

## 2017-08-30 DIAGNOSIS — Z136 Encounter for screening for cardiovascular disorders: Secondary | ICD-10-CM | POA: Diagnosis not present

## 2017-08-30 DIAGNOSIS — N183 Chronic kidney disease, stage 3 (moderate): Secondary | ICD-10-CM | POA: Diagnosis not present

## 2017-08-30 DIAGNOSIS — E785 Hyperlipidemia, unspecified: Secondary | ICD-10-CM | POA: Diagnosis not present

## 2017-09-22 ENCOUNTER — Ambulatory Visit
Admission: RE | Admit: 2017-09-22 | Discharge: 2017-09-22 | Disposition: A | Discharge status: Home / Self Care | Payer: 59 | Source: Ambulatory Visit | Attending: Urology | Admitting: Urology

## 2017-09-22 ENCOUNTER — Other Ambulatory Visit: Payer: Self-pay

## 2017-09-22 ENCOUNTER — Encounter: Payer: Self-pay | Admitting: Urology

## 2017-09-22 VITALS — BP 117/81 | HR 70 | Temp 98.1°F | Resp 20 | Ht 69.0 in | Wt 183.6 lb

## 2017-09-22 DIAGNOSIS — R3915 Urgency of urination: Secondary | ICD-10-CM | POA: Diagnosis not present

## 2017-09-22 DIAGNOSIS — R351 Nocturia: Secondary | ICD-10-CM | POA: Diagnosis not present

## 2017-09-22 DIAGNOSIS — Z923 Personal history of irradiation: Secondary | ICD-10-CM | POA: Diagnosis not present

## 2017-09-22 DIAGNOSIS — C61 Malignant neoplasm of prostate: Secondary | ICD-10-CM | POA: Insufficient documentation

## 2017-09-22 NOTE — Progress Notes (Signed)
Radiation Oncology         [phone] ________________________________  Name: Johnny Burns MRN: [id number]  Date: 09/22/2017  DOB: Oct 27, 1960  Post Treatment Note  CC: Shirline Frees, MD  Alexis Frock, MD  Diagnosis:   57 y.o.gentleman withStagepT3bN0MXadenocarcinoma of the prostate with Gleason Score of 4+5, andrisingPSA of 0.027  Interval Since Last Radiation:  4 weeks  07/03/2017 to 08/24/2017: 1. The prostate bed was treated to 45 Gy in 25 fractions of 1.8 Gy 2. The prostate bed was boosted to 23.4 Gy in 13 fractions of 1.8 Gy  Narrative:  The patient returns today for routine follow-up.  He tolerated radiation treatment relatively well.   He experienced modest fatigue and some minor urinary irritation including urgency.  He reported a steady urine stream with occasional difficulty emptying his bladder completely denied  dysuria, hematuria, urinary leakage, or incontinence.  He did not experience any diarrhea  or abdominopelvic pain.                              On review of systems, the patient states that he is doing very well overall.  He continues with  increased urgency, weak stream and nocturia x1 which is pretty much back to his baseline at this point.  He denies dysuria, gross hematuria, straining to void, intermittency, incomplete bladder emptying or incontinence.  His current IPSS score is 9 indicating moderate urinary symptoms which he feels are gradually improving.  He denies abdominal pain, nausea, vomiting or diarrhea.  He reports a healthy appetite and is maintaining his weight.  Overall, he is pleased with his progress to date.  ALLERGIES:  has No Known Allergies.  Meds: Current Outpatient Medications  Medication Sig Dispense Refill  . atorvastatin (LIPITOR) 10 MG tablet Take 10 mg by mouth every evening.    . cetirizine (ZYRTEC) 10 MG tablet Take 10 mg by mouth daily.    . fenofibrate 160 MG tablet Take 160 mg by mouth every evening.    . nystatin  cream (MYCOSTATIN) Apply 1 application topically 2 (two) times daily. Do not apply within 4 hours of radiation treatment. (Patient not taking: Reported on 09/22/2017) 30 g 0   No current facility-administered medications for this encounter.     Physical Findings:  height is 5\' 9"  (1.753 m) and weight is 183 lb 9.6 oz (83.3 kg). His oral temperature is 98.1 F (36.7 C). His blood pressure is 117/81 and his pulse is 70. His respiration is 20 and oxygen saturation is 100%.   /10 In general this is a well appearing Caucasian male in no acute distress.  He's alert and oriented x4 and appropriate throughout the examination. Cardiopulmonary assessment is negative for acute distress and he exhibits normal effort.   Lab Findings: Lab Results  Component Value Date   WBC 6.3 11/21/2016   HGB 12.8 (L) 12/01/2016   HCT 36.2 (L) 12/01/2016   MCV 90.3 11/21/2016   PLT 248 11/21/2016     Radiographic Findings: No results found.  Impression/Plan: 1. 57 y.o.gentleman withStagepT3bN0MXadenocarcinoma of the prostate with Gleason Score of 4+5, andrisingPSA of 0.027.   He will continue to follow up with urology for ongoing PSA determinations and has an appointment scheduled with Dr. Tresa Moore on 11/28/17. He understands what to expect with regards to PSA monitoring going forward. I will look forward to following his response to treatment via correspondence with urology, and would be happy to continue  to participate in his care if clinically indicated. I talked to the patient about what to expect in the future, including his risk for erectile dysfunction and rectal bleeding. I encouraged him to call or return to the office if he has any questions regarding his previous radiation or possible radiation side effects. He was comfortable with this plan and will follow up as needed.    Nicholos Johns, PA-C

## 2017-11-20 DIAGNOSIS — C61 Malignant neoplasm of prostate: Secondary | ICD-10-CM | POA: Diagnosis not present

## 2017-11-28 DIAGNOSIS — N393 Stress incontinence (female) (male): Secondary | ICD-10-CM | POA: Diagnosis not present

## 2017-11-28 DIAGNOSIS — C61 Malignant neoplasm of prostate: Secondary | ICD-10-CM | POA: Diagnosis not present

## 2017-11-29 DIAGNOSIS — I6521 Occlusion and stenosis of right carotid artery: Secondary | ICD-10-CM | POA: Diagnosis not present

## 2017-12-04 ENCOUNTER — Other Ambulatory Visit: Payer: Self-pay | Admitting: Family Medicine

## 2017-12-04 DIAGNOSIS — I6521 Occlusion and stenosis of right carotid artery: Secondary | ICD-10-CM

## 2017-12-05 ENCOUNTER — Ambulatory Visit
Admission: RE | Admit: 2017-12-05 | Discharge: 2017-12-05 | Disposition: A | Discharge status: Home / Self Care | Payer: 59 | Source: Ambulatory Visit | Attending: Family Medicine | Admitting: Family Medicine

## 2017-12-05 DIAGNOSIS — I6523 Occlusion and stenosis of bilateral carotid arteries: Secondary | ICD-10-CM | POA: Diagnosis not present

## 2017-12-05 DIAGNOSIS — I6521 Occlusion and stenosis of right carotid artery: Secondary | ICD-10-CM

## 2018-03-19 DIAGNOSIS — E785 Hyperlipidemia, unspecified: Secondary | ICD-10-CM | POA: Diagnosis not present

## 2018-03-19 DIAGNOSIS — N183 Chronic kidney disease, stage 3 (moderate): Secondary | ICD-10-CM | POA: Diagnosis not present

## 2018-03-19 DIAGNOSIS — Z0000 Encounter for general adult medical examination without abnormal findings: Secondary | ICD-10-CM | POA: Diagnosis not present

## 2018-05-24 DIAGNOSIS — C61 Malignant neoplasm of prostate: Secondary | ICD-10-CM | POA: Diagnosis not present

## 2018-05-31 DIAGNOSIS — N183 Chronic kidney disease, stage 3 (moderate): Secondary | ICD-10-CM | POA: Diagnosis not present

## 2018-05-31 DIAGNOSIS — N393 Stress incontinence (female) (male): Secondary | ICD-10-CM | POA: Diagnosis not present

## 2018-05-31 DIAGNOSIS — C61 Malignant neoplasm of prostate: Secondary | ICD-10-CM | POA: Diagnosis not present

## 2019-03-04 ENCOUNTER — Ambulatory Visit (HOSPITAL_COMMUNITY)
Admission: RE | Admit: 2019-03-04 | Discharge: 2019-03-04 | Disposition: A | Discharge status: Home / Self Care | Payer: 59 | Source: Ambulatory Visit | Attending: General Surgery | Admitting: General Surgery

## 2019-03-04 DIAGNOSIS — U071 COVID-19: Secondary | ICD-10-CM | POA: Insufficient documentation

## 2019-03-05 ENCOUNTER — Ambulatory Visit: Payer: Self-pay | Admitting: General Surgery

## 2019-03-05 LAB — NOVEL CORONAVIRUS, NAA (HOSP ORDER, SEND-OUT TO REF LAB; TAT 18-24 HRS): SARS-CoV-2, NAA: DETECTED — AB

## 2019-03-05 NOTE — Progress Notes (Signed)
Pt covid test done 03-04-2019 results are positive.  Called and spoke w/ Eyvonne Mechanic, Triage Nurse for Dr Kieth Brightly office,  Informed her that pt covid test is positive and will need to be cancelled . Eyvonne Mechanic, stated she will let Dr Kieth Brightly aware and the OR scheduler.

## 2019-03-07 ENCOUNTER — Ambulatory Visit (HOSPITAL_BASED_OUTPATIENT_CLINIC_OR_DEPARTMENT_OTHER): Admit: 2019-03-07 | Payer: 59 | Admitting: General Surgery

## 2019-03-07 ENCOUNTER — Ambulatory Visit: Payer: Self-pay | Admitting: General Surgery

## 2019-03-07 ENCOUNTER — Encounter (HOSPITAL_BASED_OUTPATIENT_CLINIC_OR_DEPARTMENT_OTHER): Payer: Self-pay

## 2019-03-07 SURGERY — LAPAROSCOPIC CHOLECYSTECTOMY
Anesthesia: General

## 2019-03-15 IMAGING — NM NM BONE WHOLE BODY
2 series · 2 of 2 positions shown · non-contrast
Comparison: CT abdomen pelvis of 10/18/2016

CLINICAL DATA: History of prostate carcinoma

EXAM:
NUCLEAR MEDICINE WHOLE BODY BONE SCAN
TECHNIQUE: Whole body anterior and posterior images were obtained approximately
3 hours after intravenous injection of radiopharmaceutical.
RADIOPHARMACEUTICALS:  20.9 mCi 6echnetium-QQm MDP IV

[Series 1: whole body · 2.66mm/px · 1 of 1 slices shown (1 of 2)]
[im 1/1]
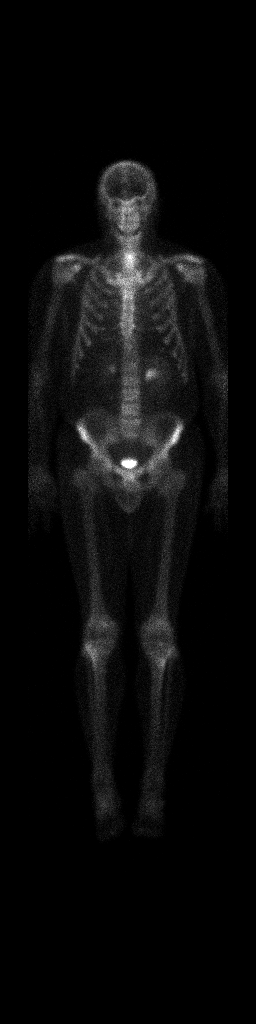

[Series 1: whole body · 2.66mm/px · 1 of 1 slices shown (2 of 2)]
[im 1/1]
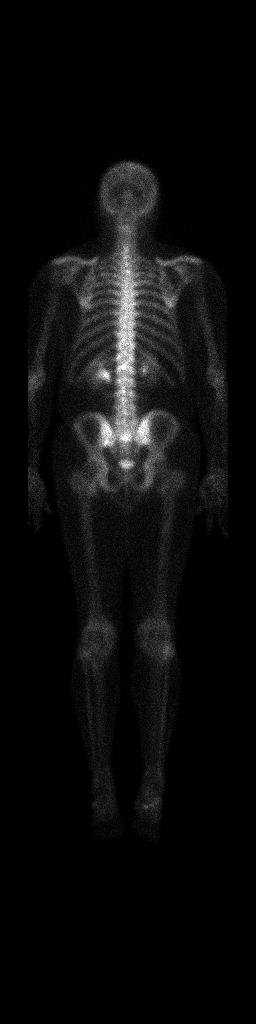

[2 of 2 positions shown; findings below may reference images not displayed]

FINDINGS: Whole-body bone scan was performed. No abnormal focus of increased
activity is seen to indicate bone metastases. The kidneys excrete
the radionuclide.
IMPRESSION: No evidence of bone metastases.

## 2019-03-29 ENCOUNTER — Encounter (HOSPITAL_BASED_OUTPATIENT_CLINIC_OR_DEPARTMENT_OTHER): Payer: Self-pay | Admitting: General Surgery

## 2019-03-29 ENCOUNTER — Other Ambulatory Visit: Payer: Self-pay

## 2019-03-29 NOTE — Progress Notes (Addendum)
Spoke w/ via phone for pre-op interview---Johnny Burns needs dos----  I stat-8            Burns results------ COVID test ------ Had positive test on 03/04/2019-patient states he never had any symptoms Arrive at -------0730 am on 04/04/2019 NPO after ------midnight Medications to take morning of surgery -----none Diabetic medication -----n/a Patient Special Instructions ----- Pre-Op special Istructions ----- Patient verbalized understanding of instructions that were given at this phone interview. Patient denies shortness of breath, chest pain, fever, cough a this phone interview.   Anesthesia Review: Chart to review medical history by Konrad Felix, PA  PCP:Dr. Shirline Frees- sees PCP for CKD-stage 3  Cardiologist : n/a Chest x-ray :n/a EKG :n/a Echo :n/a Cardiac Cath : n/a Sleep Study/ CPAP :n/a Fasting Blood Sugar :  n/a    / Checks Blood Sugar --0 times a day:   Blood Thinner/ Instructions /Last Dose:n/a ASA / Instructions/ Last Dose : n/a  Patient denies shortness of breath, chest pain, fever, and cough at this phone interview.

## 2019-04-01 ENCOUNTER — Other Ambulatory Visit (HOSPITAL_COMMUNITY): Payer: 59

## 2019-04-04 ENCOUNTER — Ambulatory Visit (HOSPITAL_BASED_OUTPATIENT_CLINIC_OR_DEPARTMENT_OTHER): Payer: 59 | Admitting: Certified Registered"

## 2019-04-04 ENCOUNTER — Ambulatory Visit (HOSPITAL_BASED_OUTPATIENT_CLINIC_OR_DEPARTMENT_OTHER)
Admission: RE | Admit: 2019-04-04 | Discharge: 2019-04-04 | Disposition: A | Discharge status: Home / Self Care | Payer: 59 | Attending: General Surgery | Admitting: General Surgery

## 2019-04-04 ENCOUNTER — Encounter (HOSPITAL_BASED_OUTPATIENT_CLINIC_OR_DEPARTMENT_OTHER): Payer: Self-pay | Admitting: General Surgery

## 2019-04-04 ENCOUNTER — Encounter (HOSPITAL_BASED_OUTPATIENT_CLINIC_OR_DEPARTMENT_OTHER)
Admission: RE | Disposition: A | Discharge status: Home / Self Care | Payer: Self-pay | Source: Home / Self Care | Attending: General Surgery

## 2019-04-04 DIAGNOSIS — N183 Chronic kidney disease, stage 3 unspecified: Secondary | ICD-10-CM | POA: Diagnosis not present

## 2019-04-04 DIAGNOSIS — Z79899 Other long term (current) drug therapy: Secondary | ICD-10-CM | POA: Diagnosis not present

## 2019-04-04 DIAGNOSIS — Z87442 Personal history of urinary calculi: Secondary | ICD-10-CM | POA: Diagnosis not present

## 2019-04-04 DIAGNOSIS — K811 Chronic cholecystitis: Secondary | ICD-10-CM | POA: Insufficient documentation

## 2019-04-04 DIAGNOSIS — E785 Hyperlipidemia, unspecified: Secondary | ICD-10-CM | POA: Insufficient documentation

## 2019-04-04 DIAGNOSIS — Z8546 Personal history of malignant neoplasm of prostate: Secondary | ICD-10-CM | POA: Insufficient documentation

## 2019-04-04 DIAGNOSIS — K219 Gastro-esophageal reflux disease without esophagitis: Secondary | ICD-10-CM | POA: Insufficient documentation

## 2019-04-04 DIAGNOSIS — Z9079 Acquired absence of other genital organ(s): Secondary | ICD-10-CM | POA: Insufficient documentation

## 2019-04-04 HISTORY — PX: CHOLECYSTECTOMY: SHX55

## 2019-04-04 HISTORY — DX: Gastro-esophageal reflux disease without esophagitis: K21.9

## 2019-04-04 LAB — POCT I-STAT, CHEM 8
BUN: 13 mg/dL (ref 6–20)
Calcium, Ion: 1.27 mmol/L (ref 1.15–1.40)
Chloride: 108 mmol/L (ref 98–111)
Creatinine, Ser: 1.5 mg/dL — ABNORMAL HIGH (ref 0.61–1.24)
Glucose, Bld: 94 mg/dL (ref 70–99)
HCT: 46 % (ref 39.0–52.0)
Hemoglobin: 15.6 g/dL (ref 13.0–17.0)
Potassium: 3.5 mmol/L (ref 3.5–5.1)
Sodium: 146 mmol/L — ABNORMAL HIGH (ref 135–145)
TCO2: 25 mmol/L (ref 22–32)

## 2019-04-04 SURGERY — LAPAROSCOPIC CHOLECYSTECTOMY
Anesthesia: General | Site: Abdomen

## 2019-04-04 MED ORDER — CHLORHEXIDINE GLUCONATE CLOTH 2 % EX PADS
6.0000 | MEDICATED_PAD | Freq: Once | CUTANEOUS | Status: DC
  Filled 2019-04-04: qty 6

## 2019-04-04 MED ORDER — HYDROCODONE-ACETAMINOPHEN 5-325 MG PO TABS: 1.0000 | ORAL_TABLET | Freq: Four times a day (QID) | ORAL | 0 refills | Status: DC | PRN

## 2019-04-04 MED ORDER — BUPIVACAINE HCL 0.5 % IJ SOLN
INTRAMUSCULAR | Status: DC | PRN
  Administered 2019-04-04: 29 mL

## 2019-04-04 MED ORDER — CEFAZOLIN SODIUM-DEXTROSE 2-4 GM/100ML-% IV SOLN
2.0000 g | INTRAVENOUS | Status: AC
  Administered 2019-04-04: 2 g via INTRAVENOUS
  Filled 2019-04-04: qty 100

## 2019-04-04 MED ORDER — PROMETHAZINE HCL 25 MG/ML IJ SOLN
6.2500 mg | INTRAMUSCULAR | Status: DC | PRN
  Filled 2019-04-04: qty 1

## 2019-04-04 MED ORDER — FENTANYL CITRATE (PF) 100 MCG/2ML IJ SOLN
INTRAMUSCULAR | Status: AC
  Filled 2019-04-04: qty 2

## 2019-04-04 MED ORDER — ONDANSETRON HCL 4 MG/2ML IJ SOLN
INTRAMUSCULAR | Status: AC
  Filled 2019-04-04: qty 2

## 2019-04-04 MED ORDER — LIDOCAINE 2% (20 MG/ML) 5 ML SYRINGE
INTRAMUSCULAR | Status: DC | PRN
  Administered 2019-04-04: 100 mg via INTRAVENOUS

## 2019-04-04 MED ORDER — SODIUM CHLORIDE 0.9 % IV SOLN
INTRAVENOUS | Status: DC
  Filled 2019-04-04: qty 1000

## 2019-04-04 MED ORDER — FENTANYL CITRATE (PF) 100 MCG/2ML IJ SOLN
25.0000 ug | INTRAMUSCULAR | Status: DC | PRN
  Filled 2019-04-04: qty 1

## 2019-04-04 MED ORDER — EPHEDRINE SULFATE-NACL 50-0.9 MG/10ML-% IV SOSY
PREFILLED_SYRINGE | INTRAVENOUS | Status: DC | PRN
  Administered 2019-04-04: 10 mg via INTRAVENOUS

## 2019-04-04 MED ORDER — CEFAZOLIN SODIUM-DEXTROSE 2-4 GM/100ML-% IV SOLN
INTRAVENOUS | Status: AC
  Filled 2019-04-04: qty 100

## 2019-04-04 MED ORDER — DEXAMETHASONE SODIUM PHOSPHATE 10 MG/ML IJ SOLN
INTRAMUSCULAR | Status: DC | PRN
  Administered 2019-04-04: 10 mg via INTRAVENOUS

## 2019-04-04 MED ORDER — ROCURONIUM BROMIDE 10 MG/ML (PF) SYRINGE
PREFILLED_SYRINGE | INTRAVENOUS | Status: DC | PRN
  Administered 2019-04-04: 70 mg via INTRAVENOUS

## 2019-04-04 MED ORDER — OXYCODONE HCL 5 MG PO TABS
ORAL_TABLET | ORAL | Status: AC
  Filled 2019-04-04: qty 1

## 2019-04-04 MED ORDER — ACETAMINOPHEN 10 MG/ML IV SOLN
1000.0000 mg | Freq: Once | INTRAVENOUS | Status: DC | PRN
  Filled 2019-04-04: qty 100

## 2019-04-04 MED ORDER — FENTANYL CITRATE (PF) 100 MCG/2ML IJ SOLN
INTRAMUSCULAR | Status: DC | PRN
  Administered 2019-04-04: 100 ug via INTRAVENOUS
  Administered 2019-04-04: 50 ug via INTRAVENOUS

## 2019-04-04 MED ORDER — LIDOCAINE 2% (20 MG/ML) 5 ML SYRINGE
INTRAMUSCULAR | Status: AC
  Filled 2019-04-04: qty 5

## 2019-04-04 MED ORDER — OXYCODONE HCL 5 MG PO TABS
5.0000 mg | ORAL_TABLET | Freq: Once | ORAL | Status: AC | PRN
  Administered 2019-04-04: 5 mg via ORAL
  Filled 2019-04-04: qty 1

## 2019-04-04 MED ORDER — MIDAZOLAM HCL 2 MG/2ML IJ SOLN
INTRAMUSCULAR | Status: AC
  Filled 2019-04-04: qty 2

## 2019-04-04 MED ORDER — CELECOXIB 400 MG PO CAPS
400.0000 mg | ORAL_CAPSULE | ORAL | Status: AC
  Administered 2019-04-04: 400 mg via ORAL
  Filled 2019-04-04: qty 1

## 2019-04-04 MED ORDER — MEPERIDINE HCL 25 MG/ML IJ SOLN
6.2500 mg | INTRAMUSCULAR | Status: DC | PRN
  Filled 2019-04-04: qty 1

## 2019-04-04 MED ORDER — ONDANSETRON HCL 4 MG/2ML IJ SOLN
INTRAMUSCULAR | Status: DC | PRN
  Administered 2019-04-04: 4 mg via INTRAVENOUS

## 2019-04-04 MED ORDER — ACETAMINOPHEN 500 MG PO TABS
ORAL_TABLET | ORAL | Status: AC
  Filled 2019-04-04: qty 2

## 2019-04-04 MED ORDER — GABAPENTIN 300 MG PO CAPS
300.0000 mg | ORAL_CAPSULE | ORAL | Status: AC
  Administered 2019-04-04: 300 mg via ORAL
  Filled 2019-04-04: qty 1

## 2019-04-04 MED ORDER — SUGAMMADEX SODIUM 500 MG/5ML IV SOLN
INTRAVENOUS | Status: DC | PRN
  Administered 2019-04-04: 350 mg via INTRAVENOUS

## 2019-04-04 MED ORDER — PROPOFOL 10 MG/ML IV BOLUS
INTRAVENOUS | Status: AC
  Filled 2019-04-04: qty 20

## 2019-04-04 MED ORDER — ACETAMINOPHEN 500 MG PO TABS
1000.0000 mg | ORAL_TABLET | ORAL | Status: AC
  Administered 2019-04-04: 1000 mg via ORAL
  Filled 2019-04-04: qty 2

## 2019-04-04 MED ORDER — DEXAMETHASONE SODIUM PHOSPHATE 10 MG/ML IJ SOLN
INTRAMUSCULAR | Status: AC
  Filled 2019-04-04: qty 1

## 2019-04-04 MED ORDER — PROPOFOL 10 MG/ML IV BOLUS
INTRAVENOUS | Status: DC | PRN
  Administered 2019-04-04: 200 mg via INTRAVENOUS

## 2019-04-04 MED ORDER — IBUPROFEN 800 MG PO TABS: 800.0000 mg | ORAL_TABLET | Freq: Three times a day (TID) | ORAL | 0 refills | Status: DC | PRN

## 2019-04-04 MED ORDER — CELECOXIB 200 MG PO CAPS
ORAL_CAPSULE | ORAL | Status: AC
  Filled 2019-04-04: qty 2

## 2019-04-04 MED ORDER — MIDAZOLAM HCL 5 MG/5ML IJ SOLN
INTRAMUSCULAR | Status: DC | PRN
  Administered 2019-04-04: 2 mg via INTRAVENOUS

## 2019-04-04 MED ORDER — OXYCODONE HCL 5 MG/5ML PO SOLN
5.0000 mg | Freq: Once | ORAL | Status: AC | PRN
  Filled 2019-04-04: qty 5

## 2019-04-04 MED ORDER — ACETAMINOPHEN 160 MG/5ML PO SOLN
325.0000 mg | ORAL | Status: DC | PRN
  Filled 2019-04-04: qty 20.3

## 2019-04-04 MED ORDER — GABAPENTIN 300 MG PO CAPS
ORAL_CAPSULE | ORAL | Status: AC
  Filled 2019-04-04: qty 1

## 2019-04-04 MED ORDER — ACETAMINOPHEN 325 MG PO TABS
325.0000 mg | ORAL_TABLET | ORAL | Status: DC | PRN
  Filled 2019-04-04: qty 2

## 2019-04-04 SURGICAL SUPPLY — 49 items
APPLIER CLIP ROT 10 11.4 M/L (STAPLE)
BENZOIN TINCTURE PRP APPL 2/3 (GAUZE/BANDAGES/DRESSINGS) ×2 IMPLANT
BNDG ADH 1X3 SHEER STRL LF (GAUZE/BANDAGES/DRESSINGS) ×2 IMPLANT
CABLE HIGH FREQUENCY MONO STRZ (ELECTRODE) ×2 IMPLANT
CATH CHOLANG 76X19 KUMAR (CATHETERS) IMPLANT
CHLORAPREP W/TINT 26 (MISCELLANEOUS) ×2 IMPLANT
CLIP APPLIE ROT 10 11.4 M/L (STAPLE) IMPLANT
CLIP VESOLOCK LG 6/CT PURPLE (CLIP) IMPLANT
CLIP VESOLOCK MED LG 6/CT (CLIP) ×2 IMPLANT
COVER MAYO STAND STRL (DRAPES) ×2 IMPLANT
COVER WAND RF STERILE (DRAPES) ×2 IMPLANT
DECANTER SPIKE VIAL GLASS SM (MISCELLANEOUS) ×2 IMPLANT
DERMABOND ADVANCED (GAUZE/BANDAGES/DRESSINGS) ×1
DERMABOND ADVANCED .7 DNX12 (GAUZE/BANDAGES/DRESSINGS) ×1 IMPLANT
DRAIN CHANNEL 19F RND (DRAIN) IMPLANT
DRAPE C-ARM 42X120 X-RAY (DRAPES) IMPLANT
ELECT REM PT RETURN 9FT ADLT (ELECTROSURGICAL) ×2
ELECTRODE REM PT RTRN 9FT ADLT (ELECTROSURGICAL) ×1 IMPLANT
EVACUATOR SILICONE 100CC (DRAIN) IMPLANT
GLOVE BIOGEL PI IND STRL 7.0 (GLOVE) ×1 IMPLANT
GLOVE BIOGEL PI INDICATOR 7.0 (GLOVE) ×1
GLOVE SURG SS PI 7.0 STRL IVOR (GLOVE) ×2 IMPLANT
GOWN STRL REUS W/TWL LRG LVL3 (GOWN DISPOSABLE) ×6 IMPLANT
GRASPER SUT TROCAR 14GX15 (MISCELLANEOUS) ×2 IMPLANT
HEMOSTAT SNOW SURGICEL 2X4 (HEMOSTASIS) IMPLANT
IRRIG SUCT STRYKERFLOW 2 WTIP (MISCELLANEOUS) ×2
IRRIGATION SUCT STRKRFLW 2 WTP (MISCELLANEOUS) ×1 IMPLANT
IV CATH 14GX2 1/4 (CATHETERS) IMPLANT
KIT TURNOVER CYSTO (KITS) ×2 IMPLANT
NS IRRIG 500ML POUR BTL (IV SOLUTION) ×2 IMPLANT
PACK BASIN DAY SURGERY FS (CUSTOM PROCEDURE TRAY) ×2 IMPLANT
PENCIL SMOKE EVACUATOR (MISCELLANEOUS) IMPLANT
POUCH RETRIEVAL ECOSAC 10 (ENDOMECHANICALS) ×1 IMPLANT
POUCH RETRIEVAL ECOSAC 10MM (ENDOMECHANICALS) ×1
SCISSORS LAP 5X35 DISP (ENDOMECHANICALS) ×2 IMPLANT
SET TUBE SMOKE EVAC HIGH FLOW (TUBING) ×2 IMPLANT
STOPCOCK 4 WAY LG BORE MALE ST (IV SETS) IMPLANT
STRIP CLOSURE SKIN 1/2X4 (GAUZE/BANDAGES/DRESSINGS) ×2 IMPLANT
STRIP CLOSURE SKIN 1/4X3 (GAUZE/BANDAGES/DRESSINGS) ×2 IMPLANT
SUT ETHILON 2 0 PS N (SUTURE) IMPLANT
SUT MNCRL AB 4-0 PS2 18 (SUTURE) ×2 IMPLANT
SUT VICRYL 0 ENDOLOOP (SUTURE) IMPLANT
SUT VICRYL 0 UR6 27IN ABS (SUTURE) IMPLANT
TOWEL OR 17X26 10 PK STRL BLUE (TOWEL DISPOSABLE) ×2 IMPLANT
TRAY LAPAROSCOPIC (CUSTOM PROCEDURE TRAY) ×2 IMPLANT
TROCAR BLADELESS OPT 12M 100M (ENDOMECHANICALS) IMPLANT
TROCAR BLADELESS OPT 5 100 (ENDOMECHANICALS) ×6 IMPLANT
TROCAR XCEL NON-BLD 11X100MML (ENDOMECHANICALS) ×2 IMPLANT
WARMER LAPAROSCOPE (MISCELLANEOUS) ×2 IMPLANT

## 2019-04-04 NOTE — Anesthesia Postprocedure Evaluation (Signed)
Anesthesia Post Note  Patient: Johnny Burns  Procedure(s) Performed: LAPAROSCOPIC CHOLECYSTECTOMY (N/A Abdomen)     Patient location during evaluation: Phase II Anesthesia Type: General Level of consciousness: awake and sedated Pain management: pain level controlled Vital Signs Assessment: post-procedure vital signs reviewed and stable Respiratory status: spontaneous breathing Cardiovascular status: stable Postop Assessment: no apparent nausea or vomiting Anesthetic complications: no    Last Vitals:  Vitals:   04/04/19 1145 04/04/19 1200  BP: 120/81 119/76  Pulse: 65 73  Resp: 14 16  Temp:    SpO2: 98% 97%    Last Pain:  Vitals:   04/04/19 1200  TempSrc:   PainSc: 3    Pain Goal: Patients Stated Pain Goal: 5 (04/04/19 0802)                 Huston Foley

## 2019-04-04 NOTE — H&P (Signed)
Johnny Burns is an 59 y.o. male.   Chief Complaint: abdominal pain HPI: 59 yo male with multiple months of abdominal pain. Work up shows a large stone in the gallbladder and normal lab studies. He presents today for gallbladder surgery.  Past Medical History:  Diagnosis Date  . Chronic kidney disease    Stage 3 sees primary physcian  . GERD (gastroesophageal reflux disease)   . History of kidney stones   . Hyperlipidemia   . Left ureteral calculus   . Prostate cancer [facility])     Past Surgical History:  Procedure Laterality Date  . CYSTOSCOPY WITH RETROGRADE PYELOGRAM, URETEROSCOPY AND STENT PLACEMENT Left 11/30/2012   Procedure: CYSTOSCOPY WITH LEFT RETROGRADE PYELOGRAM, LEFT URETEROSCOPY LEFT STONE EXTRACTION WITH BASKET  AND LEFT STENT PLACEMENT;  Surgeon: Alexis Frock, MD;  Location: [facility];  Service: Urology;  Laterality: Left;  . EXTRACORPOREAL SHOCK WAVE LITHOTRIPSY Left 10-08-2012  . INGUINAL HERNIA REPAIR Right 1990  . LYMPHADENECTOMY Bilateral 11/30/2016   Procedure: PELVIC LYMPHADENECTOMY;  Surgeon: Alexis Frock, MD;  Location: [facility];  Service: Urology;  Laterality: Bilateral;  . ROBOT ASSISTED LAPAROSCOPIC RADICAL PROSTATECTOMY N/A 11/30/2016   Procedure: XI ROBOTIC ASSISTED LAPAROSCOPIC RADICAL PROSTATECTOMY;  Surgeon: Alexis Frock, MD;  Location: [facility];  Service: Urology;  Laterality: N/A;  . WISDOM TOOTH EXTRACTION    . XI ROBOTIC ASSISTED SIMPLE PROSTATECTOMY     Dr. Tresa Moore 11/30/16    Family History  Problem Relation Age of Onset  . Cancer Father        throat  . Hypertension Brother    Social History:  reports that he has never smoked. He has never used smokeless tobacco. He reports that he does not drink alcohol or use drugs.  Allergies: No Known Allergies  Medications Prior to Admission  Medication Sig Dispense Refill  . atorvastatin (LIPITOR) 10 MG tablet Take 10 mg by mouth every evening.    . cetirizine (ZYRTEC) 10 MG tablet  Take 10 mg by mouth at bedtime.     . fenofibrate 160 MG tablet Take 160 mg by mouth every evening.    . pantoprazole (PROTONIX) 40 MG tablet Take 40 mg by mouth at bedtime.      Results for orders placed or performed during the hospital encounter of 04/04/19 (from the past 48 hour(s))  I-STAT, chem 8     Status: Abnormal   Collection Time: 04/04/19  8:13 AM  Result Value Ref Range   Sodium 146 (H) 135 - 145 mmol/L   Potassium 3.5 3.5 - 5.1 mmol/L   Chloride 108 98 - 111 mmol/L   BUN 13 6 - 20 mg/dL   Creatinine, Ser 1.50 (H) 0.61 - 1.24 mg/dL   Glucose, Bld 94 70 - 99 mg/dL   Calcium, Ion 1.27 1.15 - 1.40 mmol/L   TCO2 25 22 - 32 mmol/L   Hemoglobin 15.6 13.0 - 17.0 g/dL   HCT 46.0 39.0 - 52.0 %   No results found.  Review of Systems  Constitutional: Negative for chills and fever.  HENT: Negative for hearing loss.   Respiratory: Negative for cough.   Cardiovascular: Negative for chest pain and palpitations.  Gastrointestinal: Positive for abdominal pain. Negative for nausea and vomiting.  Genitourinary: Negative for dysuria and urgency.  Musculoskeletal: Negative for myalgias and neck pain.  Skin: Negative for rash.  Neurological: Negative for dizziness and headaches.  Hematological: Does not bruise/bleed easily.  Psychiatric/Behavioral: Negative for suicidal ideas.    Blood  pressure 125/83, pulse 67, temperature 98.1 F (36.7 C), temperature source Oral, resp. rate 14, height 5\' 10"  (1.778 m), weight 83.7 kg, SpO2 100 %. Physical Exam  Vitals reviewed. Constitutional: He is oriented to person, place, and time. He appears well-developed and well-nourished.  HENT:  Head: Normocephalic and atraumatic.  Eyes: Pupils are equal, round, and reactive to light. Conjunctivae and EOM are normal.  Cardiovascular: Normal rate and regular rhythm.  Respiratory: Effort normal and breath sounds normal.  GI: Soft. Bowel sounds are normal. He exhibits no distension. There is no abdominal  tenderness.  Musculoskeletal:        General: Normal range of motion.     Cervical back: Normal range of motion and neck supple.  Neurological: He is alert and oriented to person, place, and time.  Skin: Skin is warm and dry.  Psychiatric: He has a normal mood and affect. His behavior is normal.     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

## 2019-04-04 NOTE — Anesthesia Preprocedure Evaluation (Signed)
Anesthesia Evaluation  Patient identified by MRN, date of birth, ID band Patient awake    Reviewed: Allergy & Precautions, NPO status , Patient's Chart, lab work & pertinent test results  Airway Mallampati: I  TM Distance: >3 FB Neck ROM: Full    Dental no notable dental hx. (+) Teeth Intact   Pulmonary neg pulmonary ROS,    breath sounds clear to auscultation       Cardiovascular negative cardio ROS Normal cardiovascular exam Rhythm:Regular Rate:Normal     Neuro/Psych negative neurological ROS  negative psych ROS   GI/Hepatic Neg liver ROS, GERD  Medicated and Controlled,  Endo/Other  negative endocrine ROS  Renal/GU Renal InsufficiencyRenal disease  negative genitourinary   Musculoskeletal negative musculoskeletal ROS (+)   Abdominal Normal abdominal exam  (+)   Peds  Hematology negative hematology ROS (+)   Anesthesia Other Findings   Reproductive/Obstetrics                             Lab Results  Component Value Date   WBC 6.3 11/21/2016   HGB 15.6 04/04/2019   HCT 46.0 04/04/2019   MCV 90.3 11/21/2016   PLT 248 11/21/2016   Lab Results  Component Value Date   CREATININE 1.50 (H) 04/04/2019   BUN 13 04/04/2019   NA 146 (H) 04/04/2019   K 3.5 04/04/2019   CL 108 04/04/2019   CO2 23 12/01/2016    Anesthesia Physical  Anesthesia Plan  ASA: II  Anesthesia Plan: General   Post-op Pain Management:    Induction: Intravenous  PONV Risk Score and Plan: 3 and Ondansetron, Dexamethasone, Midazolam and Treatment may vary due to age or medical condition  Airway Management Planned: Oral ETT  Additional Equipment: None  Intra-op Plan:   Post-operative Plan: Extubation in OR  Informed Consent: I have reviewed the patients History and Physical, chart, labs and discussed the procedure including the risks, benefits and alternatives for the proposed anesthesia with the  patient or authorized representative who has indicated his/her understanding and acceptance.     Dental advisory given  Plan Discussed with: CRNA  Anesthesia Plan Comments:         Anesthesia Quick Evaluation

## 2019-04-04 NOTE — Op Note (Signed)
PATIENT:  Johnny Burns  59 y.o. male  PRE-OPERATIVE DIAGNOSIS:  CHRONIC CALCULUS CHOLECYSTITIS  POST-OPERATIVE DIAGNOSIS:  CHRONIC CALCULUS CHOLECYSTITIS  PROCEDURE:  Procedure(s): LAPAROSCOPIC CHOLECYSTECTOMY   SURGEON:  Surgeon(s): Mayur Duman, Arta Bruce, MD  ASSISTANT: none  ANESTHESIA:   local and general  Indications for procedure: Johnny Burns is a 59 y.o. male with symptoms of Abdominal pain consistent with gallbladder disease, Confirmed by Ultrasound.  Description of procedure: The patient was brought into the operative suite, placed supine. Anesthesia was administered with endotracheal tube. Patient was strapped in place and foot board was secured. All pressure points were offloaded by foam padding. The patient was prepped and draped in the usual sterile fashion.  A small incision was made to the left subcostal area. A 86mm trocar was inserted into the peritoneal cavity with optical entry. Pneumoperitoneum was applied with high flow low pressure. 2 7mm trocars were placed in the RUQ. A 47mm trocar was placed in the supraumbilical space. Marcaine was infused to the subxiphoid space and lateral upper right abdomen in the transversus abdominis plane. Next the patient was placed in reverse trendelenberg. The gallbladder showed mild inflammation and adhesions of the omentum to the body of the gallbladder. These were taken down with cautery.  The gallbladder was retracted cephalad and lateral. The peritoneum was reflected off the infundibulum working lateral to medial. The cystic duct and cystic artery were identified and further dissection revealed a critical view. The cystic duct and cystic artery were doubly clipped and ligated.   The gallbladder was removed off the liver bed with cautery. The Gallbladder was placed in a specimen bag. The gallbladder fossa was irrigated and hemostasis was applied with cautery. The gallbladder was removed via the 77mm trocar. The fascial defect  was closed with interrupted 0 vicryl suture via laparoscopic trans-fascial suture passer. Pneumoperitoneum was removed, all trocar were removed. All incisions were closed with 4-0 monocryl subcuticular stitch. The patient woke from anesthesia and was brought to PACU in stable condition. All counts were correct  Findings: cholecystitis  Specimen: gallbladder  Blood loss: 10 ml  Local anesthesia: 30 ml marcaine  Complications: none  PLAN OF CARE: Discharge to home after PACU  PATIENT DISPOSITION:  PACU - hemodynamically stable.  Gurney Maxin, M.D. General, Bariatric, & Minimally Invasive Surgery [facility]

## 2019-04-04 NOTE — Discharge Instructions (Signed)

## 2019-04-04 NOTE — Transfer of Care (Signed)
Immediate Anesthesia Transfer of Care Note  Patient: Johnny Burns  Procedure(s) Performed: LAPAROSCOPIC CHOLECYSTECTOMY (N/A Abdomen)  Patient Location: PACU  Anesthesia Type:General  Level of Consciousness: awake, alert  and oriented  Airway & Oxygen Therapy: Patient Spontanous Breathing and Patient connected to nasal cannula oxygen  Post-op Assessment: Report given to RN and Post -op Vital signs reviewed and stable  Post vital signs: Reviewed and stable  Last Vitals:  Vitals Value Taken Time  BP 142/93 04/04/19 1052  Temp 36.4 C 04/04/19 1052  Pulse 65 04/04/19 1059  Resp 12 04/04/19 1059  SpO2 100 % 04/04/19 1059  Vitals shown include unvalidated device data.  Last Pain:  Vitals:   04/04/19 0802  TempSrc: Oral  PainSc: 0-No pain      Patients Stated Pain Goal: 5 ([id number])  Complications: No apparent anesthesia complications

## 2019-04-04 NOTE — Anesthesia Procedure Notes (Signed)
Procedure Name: Intubation Date/Time: 04/04/2019 9:57 AM Performed by: Khianna Blazina D, CRNA Pre-anesthesia Checklist: Patient identified, Emergency Drugs available, Suction available and Patient being monitored Patient Re-evaluated:Patient Re-evaluated prior to induction Oxygen Delivery Method: Circle system utilized Preoxygenation: Pre-oxygenation with 100% oxygen Induction Type: IV induction Ventilation: Mask ventilation without difficulty Laryngoscope Size: Mac and 4 Grade View: Grade I Tube type: Oral Tube size: 7.5 mm Number of attempts: 1 Airway Equipment and Method: Stylet and Oral airway Placement Confirmation: ETT inserted through vocal cords under direct vision,  positive ETCO2 and breath sounds checked- equal and bilateral Secured at: 22 cm Tube secured with: Tape Dental Injury: Teeth and Oropharynx as per pre-operative assessment

## 2019-04-05 LAB — SURGICAL PATHOLOGY

## 2019-05-03 ENCOUNTER — Inpatient Hospital Stay (HOSPITAL_COMMUNITY)
Admission: EM | Admit: 2019-05-03 | Discharge: 2019-05-05 | DRG: 388 | Disposition: A | Discharge status: Home / Self Care | Payer: 59 | Attending: Internal Medicine | Admitting: Internal Medicine

## 2019-05-03 ENCOUNTER — Encounter (HOSPITAL_COMMUNITY): Payer: Self-pay | Admitting: Emergency Medicine

## 2019-05-03 ENCOUNTER — Emergency Department (HOSPITAL_COMMUNITY): Payer: 59

## 2019-05-03 ENCOUNTER — Inpatient Hospital Stay (HOSPITAL_COMMUNITY): Payer: 59

## 2019-05-03 ENCOUNTER — Other Ambulatory Visit: Payer: Self-pay

## 2019-05-03 DIAGNOSIS — Z9049 Acquired absence of other specified parts of digestive tract: Secondary | ICD-10-CM

## 2019-05-03 DIAGNOSIS — Z8249 Family history of ischemic heart disease and other diseases of the circulatory system: Secondary | ICD-10-CM

## 2019-05-03 DIAGNOSIS — D72829 Elevated white blood cell count, unspecified: Secondary | ICD-10-CM | POA: Diagnosis present

## 2019-05-03 DIAGNOSIS — Z0189 Encounter for other specified special examinations: Secondary | ICD-10-CM

## 2019-05-03 DIAGNOSIS — Z79899 Other long term (current) drug therapy: Secondary | ICD-10-CM

## 2019-05-03 DIAGNOSIS — R0602 Shortness of breath: Secondary | ICD-10-CM

## 2019-05-03 DIAGNOSIS — K56609 Unspecified intestinal obstruction, unspecified as to partial versus complete obstruction: Secondary | ICD-10-CM | POA: Diagnosis present

## 2019-05-03 DIAGNOSIS — Z809 Family history of malignant neoplasm, unspecified: Secondary | ICD-10-CM | POA: Diagnosis not present

## 2019-05-03 DIAGNOSIS — Z87442 Personal history of urinary calculi: Secondary | ICD-10-CM | POA: Diagnosis not present

## 2019-05-03 DIAGNOSIS — L308 Other specified dermatitis: Secondary | ICD-10-CM | POA: Diagnosis present

## 2019-05-03 DIAGNOSIS — Z923 Personal history of irradiation: Secondary | ICD-10-CM

## 2019-05-03 DIAGNOSIS — Z9079 Acquired absence of other genital organ(s): Secondary | ICD-10-CM

## 2019-05-03 DIAGNOSIS — E785 Hyperlipidemia, unspecified: Secondary | ICD-10-CM | POA: Diagnosis present

## 2019-05-03 DIAGNOSIS — N183 Chronic kidney disease, stage 3 unspecified: Secondary | ICD-10-CM | POA: Diagnosis present

## 2019-05-03 DIAGNOSIS — Z8546 Personal history of malignant neoplasm of prostate: Secondary | ICD-10-CM | POA: Diagnosis not present

## 2019-05-03 DIAGNOSIS — K219 Gastro-esophageal reflux disease without esophagitis: Secondary | ICD-10-CM | POA: Diagnosis present

## 2019-05-03 DIAGNOSIS — U071 COVID-19: Secondary | ICD-10-CM | POA: Diagnosis present

## 2019-05-03 LAB — CREATININE, SERUM
Creatinine, Ser: 1.3 mg/dL — ABNORMAL HIGH (ref 0.61–1.24)
GFR calc Af Amer: >60 mL/min (ref >60)
GFR calc non Af Amer: 59 mL/min — ABNORMAL LOW (ref >60)

## 2019-05-03 LAB — COMPREHENSIVE METABOLIC PANEL
ALT: 57 U/L — ABNORMAL HIGH (ref 0–44)
AST: 41 U/L (ref 15–41)
Albumin: 3.8 g/dL (ref 3.5–5.0)
Alkaline Phosphatase: 64 U/L (ref 38–126)
Anion gap: 9 (ref 5–15)
BUN: 12 mg/dL (ref 6–20)
CO2: 23 mmol/L (ref 22–32)
Calcium: 8.9 mg/dL (ref 8.9–10.3)
Chloride: 109 mmol/L (ref 98–111)
Creatinine, Ser: 1.15 mg/dL (ref 0.61–1.24)
GFR calc Af Amer: >60 mL/min (ref >60)
GFR calc non Af Amer: >60 mL/min (ref >60)
Glucose, Bld: 111 mg/dL — ABNORMAL HIGH (ref 70–99)
Potassium: 3.9 mmol/L (ref 3.5–5.1)
Sodium: 141 mmol/L (ref 135–145)
Total Bilirubin: 1.2 mg/dL (ref 0.3–1.2)
Total Protein: 6.6 g/dL (ref 6.5–8.1)

## 2019-05-03 LAB — CBC
HCT: 48.1 % (ref 39.0–52.0)
HCT: 46.3 % (ref 39.0–52.0)
Hemoglobin: 15.9 g/dL (ref 13.0–17.0)
Hemoglobin: 15.2 g/dL (ref 13.0–17.0)
MCH: 30.6 pg (ref 26.0–34.0)
MCH: 30.5 pg (ref 26.0–34.0)
MCHC: 33.1 g/dL (ref 30.0–36.0)
MCHC: 32.8 g/dL (ref 30.0–36.0)
MCV: 92.7 fL (ref 80.0–100.0)
MCV: 92.8 fL (ref 80.0–100.0)
Platelets: 258 10*3/uL (ref 150–400)
Platelets: 253 10*3/uL (ref 150–400)
RBC: 5.19 MIL/uL (ref 4.22–5.81)
RBC: 4.99 MIL/uL (ref 4.22–5.81)
RDW: 13.3 % (ref 11.5–15.5)
RDW: 13.2 % (ref 11.5–15.5)
WBC: 10.5 10*3/uL (ref 4.0–10.5)
WBC: 12.1 10*3/uL — ABNORMAL HIGH (ref 4.0–10.5)
nRBC: 0 % (ref 0.0–0.2)
nRBC: 0 % (ref 0.0–0.2)

## 2019-05-03 LAB — URINALYSIS, ROUTINE W REFLEX MICROSCOPIC
Bilirubin Urine: NEGATIVE
Glucose, UA: NEGATIVE mg/dL
Hgb urine dipstick: NEGATIVE
Ketones, ur: NEGATIVE mg/dL
Leukocytes,Ua: NEGATIVE
Nitrite: NEGATIVE
Protein, ur: NEGATIVE mg/dL
Specific Gravity, Urine: 1.024 (ref 1.005–1.030)
pH: 5 (ref 5.0–8.0)

## 2019-05-03 LAB — LIPASE, BLOOD: Lipase: 25 U/L (ref 11–51)

## 2019-05-03 LAB — RESPIRATORY PANEL BY RT PCR (FLU A&B, COVID)
Influenza A by PCR: NEGATIVE
Influenza B by PCR: NEGATIVE
SARS Coronavirus 2 by RT PCR: POSITIVE — AB

## 2019-05-03 LAB — HIV ANTIBODY (ROUTINE TESTING W REFLEX): HIV Screen 4th Generation wRfx: NONREACTIVE

## 2019-05-03 MED ORDER — DIATRIZOATE MEGLUMINE & SODIUM 66-10 % PO SOLN
90.0000 mL | Freq: Once | ORAL | Status: AC
  Administered 2019-05-03: 90 mL via NASOGASTRIC
  Filled 2019-05-03: qty 90

## 2019-05-03 MED ORDER — MORPHINE SULFATE (PF) 2 MG/ML IV SOLN
1.0000 mg | INTRAVENOUS | Status: DC | PRN
  Administered 2019-05-03 – 2019-05-04 (×3): 2 mg via INTRAVENOUS
  Filled 2019-05-03 (×3): qty 1

## 2019-05-03 MED ORDER — ONDANSETRON HCL 4 MG/2ML IJ SOLN
4.0000 mg | Freq: Once | INTRAMUSCULAR | Status: AC
  Administered 2019-05-03: 4 mg via INTRAVENOUS
  Filled 2019-05-03: qty 2

## 2019-05-03 MED ORDER — MORPHINE SULFATE (PF) 4 MG/ML IV SOLN
4.0000 mg | Freq: Once | INTRAVENOUS | Status: AC
  Administered 2019-05-03: 11:00:00 4 mg via INTRAVENOUS
  Filled 2019-05-03: qty 1

## 2019-05-03 MED ORDER — SODIUM CHLORIDE 0.9 % IV SOLN: INTRAVENOUS | Status: DC

## 2019-05-03 MED ORDER — ONDANSETRON HCL 4 MG/2ML IJ SOLN
4.0000 mg | Freq: Four times a day (QID) | INTRAMUSCULAR | Status: DC | PRN
  Administered 2019-05-03: 4 mg via INTRAVENOUS
  Filled 2019-05-03: qty 2

## 2019-05-03 MED ORDER — HEPARIN SODIUM (PORCINE) 5000 UNIT/ML IJ SOLN
5000.0000 [IU] | Freq: Three times a day (TID) | INTRAMUSCULAR | Status: DC
  Administered 2019-05-03 – 2019-05-05 (×5): 5000 [IU] via SUBCUTANEOUS
  Filled 2019-05-03 (×5): qty 1

## 2019-05-03 MED ORDER — SODIUM CHLORIDE 0.9 % IV BOLUS
500.0000 mL | Freq: Once | INTRAVENOUS | Status: AC
  Administered 2019-05-03: 500 mL via INTRAVENOUS

## 2019-05-03 MED ORDER — PROMETHAZINE HCL 25 MG/ML IJ SOLN
12.5000 mg | Freq: Four times a day (QID) | INTRAMUSCULAR | Status: AC | PRN
  Administered 2019-05-03 – 2019-05-04 (×2): 12.5 mg via INTRAVENOUS
  Filled 2019-05-03 (×2): qty 1

## 2019-05-03 MED ORDER — MORPHINE SULFATE (PF) 4 MG/ML IV SOLN
4.0000 mg | INTRAVENOUS | Status: DC | PRN
  Administered 2019-05-03: 4 mg via INTRAVENOUS
  Filled 2019-05-03: qty 1

## 2019-05-03 MED ORDER — SODIUM CHLORIDE (PF) 0.9 % IJ SOLN
INTRAMUSCULAR | Status: AC
  Filled 2019-05-03: qty 50

## 2019-05-03 MED ORDER — ONDANSETRON HCL 4 MG PO TABS: 4.0000 mg | ORAL_TABLET | Freq: Four times a day (QID) | ORAL | Status: DC | PRN

## 2019-05-03 MED ORDER — IOHEXOL 300 MG/ML  SOLN
100.0000 mL | Freq: Once | INTRAMUSCULAR | Status: AC | PRN
  Administered 2019-05-03: 100 mL via INTRAVENOUS

## 2019-05-03 MED ORDER — DEXTROSE-NACL 5-0.45 % IV SOLN: INTRAVENOUS | Status: DC

## 2019-05-03 NOTE — ED Provider Notes (Signed)
[place] DEPT Provider Note   CSN: [id number] Arrival date & time: 05/03/19  [id number]     History Chief Complaint  Patient presents with  . Abdominal Pain    Johnny Burns is a 59 y.o. male.  He has a history of prostate cancer and had a cholecystectomy last month.  He is complaining of generalized abdominal pain radiating through to the back that started last evening.  He said it was intermittent at first 5 out of 10 intensity but has become more constant and 7 out of 10 intensity.  Associated with some nausea.  Some constipation.  No fevers chills cough chest pain vomiting diarrhea or urinary symptoms.  The history is provided by the patient.  Abdominal Pain Pain location:  Generalized Pain quality: aching   Pain radiates to:  Back Pain severity now: 7/10. Onset quality:  Gradual Duration:  12 hours Timing:  Constant Progression:  Worsening Chronicity:  New Context: not recent travel, not sick contacts and not trauma   Relieved by:  None tried Worsened by:  Movement and palpation Ineffective treatments:  OTC medications Associated symptoms: constipation and nausea   Associated symptoms: no chest pain, no chills, no cough, no diarrhea, no dysuria, no fever, no hematemesis, no hematochezia, no hematuria, no shortness of breath, no sore throat and no vomiting        Past Medical History:  Diagnosis Date  . Chronic kidney disease    Stage 3 sees primary physcian  . GERD (gastroesophageal reflux disease)   . History of kidney stones   . Hyperlipidemia   . Left ureteral calculus   . Prostate cancer [facility])     Patient Active Problem List   Diagnosis Date Noted  . Yeast dermatitis 07/20/2017  . Prostate cancer ([place]) 11/30/2016    Past Surgical History:  Procedure Laterality Date  . CHOLECYSTECTOMY N/A 04/04/2019   Procedure: LAPAROSCOPIC CHOLECYSTECTOMY;  Surgeon: Kinsinger, Arta Bruce, MD;  Location: [facility];   Service: General;  Laterality: N/A;  . CYSTOSCOPY WITH RETROGRADE PYELOGRAM, URETEROSCOPY AND STENT PLACEMENT Left 11/30/2012   Procedure: CYSTOSCOPY WITH LEFT RETROGRADE PYELOGRAM, LEFT URETEROSCOPY LEFT STONE EXTRACTION WITH BASKET  AND LEFT STENT PLACEMENT;  Surgeon: Alexis Frock, MD;  Location: [facility];  Service: Urology;  Laterality: Left;  . EXTRACORPOREAL SHOCK WAVE LITHOTRIPSY Left 10-08-2012  . INGUINAL HERNIA REPAIR Right 1990  . LYMPHADENECTOMY Bilateral 11/30/2016   Procedure: PELVIC LYMPHADENECTOMY;  Surgeon: Alexis Frock, MD;  Location: [facility];  Service: Urology;  Laterality: Bilateral;  . ROBOT ASSISTED LAPAROSCOPIC RADICAL PROSTATECTOMY N/A 11/30/2016   Procedure: XI ROBOTIC ASSISTED LAPAROSCOPIC RADICAL PROSTATECTOMY;  Surgeon: Alexis Frock, MD;  Location: [facility];  Service: Urology;  Laterality: N/A;  . WISDOM TOOTH EXTRACTION    . XI ROBOTIC ASSISTED SIMPLE PROSTATECTOMY     Dr. Tresa Moore 11/30/16       Family History  Problem Relation Age of Onset  . Cancer Father        throat  . Hypertension Brother     Social History   Tobacco Use  . Smoking status: Never Smoker  . Smokeless tobacco: Never Used  Substance Use Topics  . Alcohol use: No  . Drug use: No    Home Medications Prior to Admission medications   Medication Sig Start Date End Date Taking? Authorizing Provider  atorvastatin (LIPITOR) 10 MG tablet Take 10 mg by mouth every evening.    [provider]  cetirizine (ZYRTEC) 10  MG tablet Take 10 mg by mouth at bedtime.     [provider]  fenofibrate 160 MG tablet Take 160 mg by mouth every evening.    [provider]  HYDROcodone-acetaminophen (NORCO/VICODIN) 5-325 MG tablet Take 1 tablet by mouth every 6 (six) hours as needed for moderate pain. 04/04/19   Kinsinger, Arta Bruce, MD  ibuprofen (ADVIL) 800 MG tablet Take 1 tablet (800 mg total) by mouth every 8 (eight) hours as needed. 04/04/19   Kinsinger, Arta Bruce, MD  pantoprazole (PROTONIX) 40 MG tablet Take 40 mg by mouth at bedtime.    [provider]    Allergies    Patient has no known allergies.  Review of Systems   Review of Systems  Constitutional: Negative for chills and fever.  HENT: Negative for sore throat.   Eyes: Negative for visual disturbance.  Respiratory: Negative for cough and shortness of breath.   Cardiovascular: Negative for chest pain.  Gastrointestinal: Positive for abdominal pain, constipation and nausea. Negative for diarrhea, hematemesis, hematochezia and vomiting.  Genitourinary: Negative for dysuria and hematuria.  Musculoskeletal: Positive for back pain.  Skin: Negative for rash.  Neurological: Negative for headaches.    Physical Exam Updated Vital Signs BP (!) 138/97 (BP Location: Right Arm)   Pulse (!) 104   Temp 98.2 F (36.8 C) (Oral)   Resp 18   SpO2 99%   Physical Exam Vitals and nursing note reviewed.  Constitutional:      Appearance: He is well-developed.  HENT:     Head: Normocephalic and atraumatic.  Eyes:     Conjunctiva/sclera: Conjunctivae normal.  Cardiovascular:     Rate and Rhythm: Normal rate and regular rhythm.     Heart sounds: No murmur.  Pulmonary:     Effort: Pulmonary effort is normal. No respiratory distress.     Breath sounds: Normal breath sounds.  Abdominal:     General: There is distension.     Palpations: There is no pulsatile mass.     Tenderness: There is generalized abdominal tenderness. There is no guarding or rebound.  Musculoskeletal:        General: No deformity or signs of injury. Normal range of motion.     Cervical back: Neck supple.  Skin:    General: Skin is warm and dry.     Capillary Refill: Capillary refill takes less than 2 seconds.  Neurological:     General: No focal deficit present.     Mental Status: He is alert.     Gait: Gait normal.     ED Results / Procedures / Treatments   Labs (all labs ordered are listed, but only  abnormal results are displayed) Labs Reviewed  RESPIRATORY PANEL BY RT PCR (FLU A&B, COVID) - Abnormal; Notable for the following components:      Result Value   SARS Coronavirus 2 by RT PCR POSITIVE (*)    All other components within normal limits  COMPREHENSIVE METABOLIC PANEL - Abnormal; Notable for the following components:   Glucose, Bld 111 (*)    ALT 57 (*)    All other components within normal limits  CBC - Abnormal; Notable for the following components:   WBC 12.1 (*)    All other components within normal limits  CREATININE, SERUM - Abnormal; Notable for the following components:   Creatinine, Ser 1.30 (*)    GFR calc non Af Amer 59 (*)    All other components within normal limits  LIPASE,  BLOOD  CBC  URINALYSIS, ROUTINE W REFLEX MICROSCOPIC  HIV ANTIBODY (ROUTINE TESTING W REFLEX)  BASIC METABOLIC PANEL  CBC    EKG None  Radiology CT Abdomen Pelvis W Contrast  Result Date: 05/03/2019 CLINICAL DATA:  Onset mid abdominal pain last night. Patient status post cholecystectomy approximately 1 month ago. EXAM: CT ABDOMEN AND PELVIS WITH CONTRAST TECHNIQUE: Multidetector CT imaging of the abdomen and pelvis was performed using the standard protocol following bolus administration of intravenous contrast. CONTRAST:  100 mL OMNIPAQUE IOHEXOL 300 MG/ML  SOLN COMPARISON:  CT abdomen and pelvis 10/18/2016. FINDINGS: Lower chest: There is some dependent atelectasis in the lung bases. No pleural or pericardial effusion. Heart size is normal. Hepatobiliary: Status post cholecystectomy. No focal lesion. Mild intrahepatic biliary ductal dilatation is seen in the right hepatic lobe. Bile ducts in the left hepatic lobe in the common bile duct appear normal. Pancreas: Unremarkable. No pancreatic ductal dilatation or surrounding inflammatory changes. Spleen: Normal in size without focal abnormality. Adrenals/Urinary Tract: Adrenal glands are unremarkable. Kidneys are normal, without renal calculi,  focal lesion, or hydronephrosis. Bladder is unremarkable. Stomach/Bowel: There is mild dilatation of small bowel loops up to approximately 3.3 cm with air-fluid levels present. Fecalized small bowel contents are seen in the right lower quadrant where there is an angulated loop and transition to complete decompression of small bowel. The stomach, colon and appendix appear normal. Vascular/Lymphatic: No significant vascular findings are present. No enlarged abdominal or pelvic lymph nodes. Reproductive: Prostate is unremarkable. Other: None. Musculoskeletal: No acute or focal bony abnormality. IMPRESSION: Findings consistent with mild small bowel obstruction with a transition point identified in the pelvis where a kinked loop of small bowel is identified most consistent with an adhesion. Status post cholecystectomy. Minimal prominence of intrahepatic biliary ducts in the right hepatic lobe may be incidental. Left intrahepatic ducts the common bile duct are normal. Recommend correlation with liver function tests. Electronically Signed   By: Inge Rise M.D.   On: 05/03/2019 12:19   DG Abd Portable 1V-Small Bowel Protocol-Position Verification  Result Date: 05/03/2019 CLINICAL DATA:  NG tube placement. EXAM: PORTABLE ABDOMEN - 1 VIEW COMPARISON:  CT scan same date. FINDINGS: The NG tube tip is in the fundal region of the stomach. Stable mildly distended bowel loops with air-fluid levels. No free air. Contrast noted in the kidneys in ureters from today CT scan. IMPRESSION: NG tube tip is in the fundal region of the stomach. Electronically Signed   By: Marijo Sanes M.D.   On: 05/03/2019 15:23    Procedures Procedures (including critical care time)  Medications Ordered in ED Medications  dextrose 5 %-0.45 % sodium chloride infusion ( Intravenous New Bag/Given 05/03/19 1249)  heparin injection 5,000 Units (has no administration in time range)  0.9 %  sodium chloride infusion ( Intravenous New Bag/Given  05/03/19 1605)  ondansetron (ZOFRAN) tablet 4 mg ( Oral See Alternative 05/03/19 1729)    Or  ondansetron (ZOFRAN) injection 4 mg (4 mg Intravenous Given 05/03/19 1729)  morphine 2 MG/ML injection 1-2 mg (2 mg Intravenous Given 05/03/19 1729)  ondansetron (ZOFRAN) injection 4 mg (4 mg Intravenous Given 05/03/19 1049)  sodium chloride 0.9 % bolus 500 mL (500 mLs Intravenous New Bag/Given 05/03/19 1051)  morphine 4 MG/ML injection 4 mg (4 mg Intravenous Given 05/03/19 1049)  iohexol (OMNIPAQUE) 300 MG/ML solution 100 mL (100 mLs Intravenous Contrast Given 05/03/19 1144)  sodium chloride (PF) 0.9 % injection (  Given by Other 05/03/19 1203)  ondansetron (ZOFRAN) injection 4 mg (4 mg Intravenous Given 05/03/19 1257)  diatrizoate meglumine-sodium (GASTROGRAFIN) 66-10 % solution 90 mL (90 mLs Per NG tube Given 05/03/19 1650)    ED Course  I have reviewed the triage vital signs and the nursing notes.  Pertinent labs & imaging results that were available during my care of the patient were reviewed by me and considered in my medical decision making (see chart for details).  Clinical Course as of May 03 1811  Fri May 03, 2019  1025 Differential includes retained common bile duct stone, pancreatitis, obstruction, colitis, diverticulitis, appendicitis   [MB]  1225 CT reading is small bowel obstruction with transition point in the right lower quadrant.  They do see some ductal dilatation but his LFTs are normal so I think less likely a retained stone.  No surgery consulted.  Will update patient.   [MB]  [id number] Discussed with Claiborne Billings from general surgery.  She recommended a medical admit and she will put in orders for the small bowel protocol   [MB]  1333 Discussed with [facility] hospitalist Dr Waldron Labs who will evaluate the patient for admission.   [MB]    Clinical Course User Index [MB] Hayden Rasmussen, MD   MDM Rules/Calculators/A&P                       Final Clinical Impression(s) / ED Diagnoses Final diagnoses:    SBO (small bowel obstruction) [facility])    Rx / DC Orders ED Discharge Orders    None       Hayden Rasmussen, MD 05/03/19 1815

## 2019-05-03 NOTE — H&P (Addendum)
TRH H&P   Patient Demographics:    Johnny Burns, is a 59 y.o. male  MRN: [id number]   DOB - 1960-06-21  Admit Date - 05/03/2019  Outpatient Primary MD for the patient is Shirline Frees, MD  Referring MD/NP/PA: Dr Melina Copa  Patient coming from: Home  Chief Complaint  Patient presents with  . Abdominal Pain      HPI:    Johnny Burns  is a 59 y.o. male, with past medical history of prostate cancer status post prostatectomy, hyperlipidemia, with recent laparoscopic cholecystectomy last month, patient presents to ED secondary to complaints of abdominal pain, started over last 24 hours, radiating to the back, as well he does report some nausea developed this morning, [place] any vomiting, reports last bowel movement was yesterday, as well did not pass any gas since yesterday, he denies any fever, chills, cough, chest pain, no diarrhea or any urinary symptoms, he denies any history of SBO in the past. -In ED CT abdomen pelvis significant for small bowel obstruction with transition point in the pelvis area,I was called to admit.     Review of systems:    In addition to the HPI above,  No Fever-chills, No Headache, No changes with Vision or hearing, No problems swallowing food or Liquids, No Chest pain, Cough or Shortness of Breath, Complaints of abdominal pain, nausea, no bowel movement or gas since yesterday, no nausea. No Blood in stool or Urine, No dysuria, No new skin rashes or bruises, No new joints pains-aches,  No new weakness, tingling, numbness in any extremity, No recent weight gain or loss, No polyuria, polydypsia or polyphagia, No significant Mental Stressors.  A full 10 point Review of Systems was done, except as stated above, all other Review of Systems were negative.   With Past History of the following :    Past Medical History:  Diagnosis Date  .  Chronic kidney disease    Stage 3 sees primary physcian  . GERD (gastroesophageal reflux disease)   . History of kidney stones   . Hyperlipidemia   . Left ureteral calculus   . Prostate cancer [facility])       Past Surgical History:  Procedure Laterality Date  . CHOLECYSTECTOMY N/A 04/04/2019   Procedure: LAPAROSCOPIC CHOLECYSTECTOMY;  Surgeon: Kinsinger, Arta Bruce, MD;  Location: [facility];  Service: General;  Laterality: N/A;  . CYSTOSCOPY WITH RETROGRADE PYELOGRAM, URETEROSCOPY AND STENT PLACEMENT Left 11/30/2012   Procedure: CYSTOSCOPY WITH LEFT RETROGRADE PYELOGRAM, LEFT URETEROSCOPY LEFT STONE EXTRACTION WITH BASKET  AND LEFT STENT PLACEMENT;  Surgeon: Alexis Frock, MD;  Location: [facility];  Service: Urology;  Laterality: Left;  . EXTRACORPOREAL SHOCK WAVE LITHOTRIPSY Left 10-08-2012  . INGUINAL HERNIA REPAIR Right 1990  . LYMPHADENECTOMY Bilateral 11/30/2016   Procedure: PELVIC LYMPHADENECTOMY;  Surgeon: Alexis Frock, MD;  Location: [facility];  Service: Urology;  Laterality: Bilateral;  .  ROBOT ASSISTED LAPAROSCOPIC RADICAL PROSTATECTOMY N/A 11/30/2016   Procedure: XI ROBOTIC ASSISTED LAPAROSCOPIC RADICAL PROSTATECTOMY;  Surgeon: Alexis Frock, MD;  Location: [facility];  Service: Urology;  Laterality: N/A;  . WISDOM TOOTH EXTRACTION    . XI ROBOTIC ASSISTED SIMPLE PROSTATECTOMY     Dr. Tresa Moore 11/30/16      Social History:     Social History   Tobacco Use  . Smoking status: Never Smoker  . Smokeless tobacco: Never Used  Substance Use Topics  . Alcohol use: No      Family History :     Family History  Problem Relation Age of Onset  . Cancer Father        throat  . Hypertension Brother       Home Medications:   Prior to Admission medications   Medication Sig Start Date End Date Taking? Authorizing Provider  Ascorbic Acid (VITAMIN C) 1000 MG tablet Take 1,000 mg by mouth daily.   Yes [provider]  atorvastatin (LIPITOR) 10  MG tablet Take 10 mg by mouth every evening.   Yes [provider]  cetirizine (ZYRTEC) 10 MG tablet Take 10 mg by mouth at bedtime as needed for allergies.    Yes [provider]  fenofibrate 160 MG tablet Take 160 mg by mouth every evening.   Yes [provider]  Multiple Vitamins-Minerals (ZINC PO) Take 1 tablet by mouth daily.   Yes [provider]  pantoprazole (PROTONIX) 40 MG tablet Take 40 mg by mouth at bedtime.   Yes [provider]  polyvinyl alcohol (LIQUIFILM TEARS) 1.4 % ophthalmic solution Place 1 drop into both eyes as needed for dry eyes.   Yes [provider]  HYDROcodone-acetaminophen (NORCO/VICODIN) 5-325 MG tablet Take 1 tablet by mouth every 6 (six) hours as needed for moderate pain. Patient not taking: Reported on 05/03/2019 04/04/19   Kinsinger, Arta Bruce, MD  ibuprofen (ADVIL) 800 MG tablet Take 1 tablet (800 mg total) by mouth every 8 (eight) hours as needed. Patient not taking: Reported on 05/03/2019 04/04/19   Kinsinger, Arta Bruce, MD     Allergies:    No Known Allergies   Physical Exam:   Vitals  Blood pressure 127/82, pulse 72, temperature 98.2 F (36.8 C), temperature source Oral, resp. rate 18, SpO2 99 %.   1. General well developed male, lying in bed in NAD,   2. Normal affect and insight, Not Suicidal or Homicidal, Awake Alert, Oriented X 3.  3. No F.N deficits, ALL C.Nerves Intact, Strength 5/5 all 4 extremities, Sensation intact all 4 extremities, Plantars down going.  4. Ears and Eyes appear Normal, Conjunctivae clear, PERRLA. Moist Oral Mucosa.  5. Supple Neck, No JVD, No cervical lymphadenopathy appriciated, No Carotid Bruits.  6. Symmetrical Chest wall movement, Good air movement bilaterally, CTAB.  7. RRR, No Gallops, Rubs or Murmurs, No Parasternal Heave.  8. Decreased  Bowel Sounds, Abdomen Soft, minimal diffuse  tenderness, No organomegaly appriciated,No rebound -guarding or  rigidity.  9.  No Cyanosis, Normal Skin Turgor, No Skin Rash or Bruise.  10. Good muscle tone,  joints appear normal , no effusions, Normal ROM.    Data Review:    CBC Recent Labs  Lab 05/03/19 0913  WBC 10.5  HGB 15.9  HCT 48.1  PLT 258  MCV 92.7  MCH 30.6  MCHC 33.1  RDW 13.3   ------------------------------------------------------------------------------------------------------------------  Chemistries  Recent Labs  Lab 05/03/19 0913  NA 141  K 3.9  CL 109  CO2 23  GLUCOSE 111*  BUN 12  CREATININE 1.15  CALCIUM 8.9  AST 41  ALT 57*  ALKPHOS 64  BILITOT 1.2   ------------------------------------------------------------------------------------------------------------------ CrCl cannot be calculated (Unknown ideal weight.). ------------------------------------------------------------------------------------------------------------------ No results for input(s): TSH, T4TOTAL, T3FREE, THYROIDAB in the last 72 hours.  Invalid input(s): FREET3  Coagulation profile No results for input(s): INR, PROTIME in the last 168 hours. ------------------------------------------------------------------------------------------------------------------- No results for input(s): DDIMER in the last 72 hours. -------------------------------------------------------------------------------------------------------------------  Cardiac Enzymes No results for input(s): CKMB, TROPONINI, MYOGLOBIN in the last 168 hours.  Invalid input(s): CK ------------------------------------------------------------------------------------------------------------------ No results found for: BNP   ---------------------------------------------------------------------------------------------------------------  Urinalysis    Component Value Date/Time   COLORURINE YELLOW 05/03/2019 1139   APPEARANCEUR CLEAR 05/03/2019 1139   LABSPEC 1.024 05/03/2019 1139   PHURINE 5.0 05/03/2019 1139   GLUCOSEU  NEGATIVE 05/03/2019 1139   HGBUR NEGATIVE 05/03/2019 1139   BILIRUBINUR NEGATIVE 05/03/2019 1139   KETONESUR NEGATIVE 05/03/2019 1139   PROTEINUR NEGATIVE 05/03/2019 1139   UROBILINOGEN 0.2 10/09/2012 0440   NITRITE NEGATIVE 05/03/2019 1139   LEUKOCYTESUR NEGATIVE 05/03/2019 1139    ----------------------------------------------------------------------------------------------------------------   Imaging Results:    CT Abdomen Pelvis W Contrast  Result Date: 05/03/2019 CLINICAL DATA:  Onset mid abdominal pain last night. Patient status post cholecystectomy approximately 1 month ago. EXAM: CT ABDOMEN AND PELVIS WITH CONTRAST TECHNIQUE: Multidetector CT imaging of the abdomen and pelvis was performed using the standard protocol following bolus administration of intravenous contrast. CONTRAST:  100 mL OMNIPAQUE IOHEXOL 300 MG/ML  SOLN COMPARISON:  CT abdomen and pelvis 10/18/2016. FINDINGS: Lower chest: There is some dependent atelectasis in the lung bases. No pleural or pericardial effusion. Heart size is normal. Hepatobiliary: Status post cholecystectomy. No focal lesion. Mild intrahepatic biliary ductal dilatation is seen in the right hepatic lobe. Bile ducts in the left hepatic lobe in the common bile duct appear normal. Pancreas: Unremarkable. No pancreatic ductal dilatation or surrounding inflammatory changes. Spleen: Normal in size without focal abnormality. Adrenals/Urinary Tract: Adrenal glands are unremarkable. Kidneys are normal, without renal calculi, focal lesion, or hydronephrosis. Bladder is unremarkable. Stomach/Bowel: There is mild dilatation of small bowel loops up to approximately 3.3 cm with air-fluid levels present. Fecalized small bowel contents are seen in the right lower quadrant where there is an angulated loop and transition to complete decompression of small bowel. The stomach, colon and appendix appear normal. Vascular/Lymphatic: No significant vascular findings are present. No  enlarged abdominal or pelvic lymph nodes. Reproductive: Prostate is unremarkable. Other: None. Musculoskeletal: No acute or focal bony abnormality. IMPRESSION: Findings consistent with mild small bowel obstruction with a transition point identified in the pelvis where a kinked loop of small bowel is identified most consistent with an adhesion. Status post cholecystectomy. Minimal prominence of intrahepatic biliary ducts in the right hepatic lobe may be incidental. Left intrahepatic ducts the common bile duct are normal. Recommend correlation with liver function tests. Electronically Signed   By: Inge Rise M.D.   On: 05/03/2019 12:19     Assessment & Plan:    Active Problems:   SOB (shortness of breath)   SBO (small bowel obstruction) (HCC)    SBO -Patient presents with abdominal pain, nausea, no bowel movement, no gas for last 24 hours, imaging significant for mild bowel obstruction in the pelvic area, this is most likely due to his history of prostatectomy. -Continue with IV fluids, monitor electrolytes closely and replete as needed. -Keep n.p.o.. -Continue with as needed pain  and nausea medications. -Further management per general surgery, they were consulted in ED.  Hyperlipidemia -Currently n.p.o., resume fenofibrate and atorvastatin once able to take oral  Recent laparoscopic cholecystectomy -Incision site looks clean, no evidence of infection or complication from surgery, bile duct within normal limit.  Patient positive for COVID-19 03/04/2019, as well had repeat positive test this admission, he is out of 21 days isolation.,  And as well within 49-month with him the most recent positive test, so no need for isolation  DVT Prophylaxis Heparin - SCDs   AM Labs Ordered, also please review Full Orders  Family Communication: Admission, patients condition and plan of care including tests being ordered have been discussed with the patient who indicate understanding and agree with the  plan and Code Status.  Code Status Inpatient  Likely DC to  Home  Condition GUARDED    Consults called: General Surgery  Admission status: inpatient  Time spent in minutes : 40 minutes   Phillips Climes M.D on 05/03/2019 at 1:37 PM  Between 7am to 7pm - Pager - [phone]. After 7pm go to [email or web address] - password [facility]  [facility] - Office  [phone]

## 2019-05-03 NOTE — Progress Notes (Signed)
Attempted to call for report on pt x3 and no answer.

## 2019-05-03 NOTE — Consult Note (Signed)
Johnny Burns 1960-04-25  [id number].    Requesting MD: Dr. Aletta Edouard Chief Complaint/Reason for Consult: SBO  HPI:  This is a 11 white male with a history of CKD, stage 3, HLD, prostate cancer, s/p prostatectomy with lymphadenectomy with radiation 2 years ago.  He just underwent a lap chole by Dr. Kieth Brightly 1 month ago and has been recovering well from this.  Yesterday afternoon, he began to have some abdominal discomfort which progressed last night.  He developed some nausea, but no emesis.  He denies any fevers, chills, chest pain, SOB, etc.  His pain is crampy in nature initially and then became more constant.  Due to persistent symptoms that did not resolve with any treatment, he presented to the ED today.  His last BM was yesterday morning and he passed flatus a couple of days ago.  He underwent a CT scan that reveals a small bowel obstruction with a transition zone in the his pelvis.  We have been asked to see him.  ROS: ROS: Please see HPI, otherwise all other systems have been reviewed and are negative.  Family History  Problem Relation Age of Onset  . Cancer Father        throat  . Hypertension Brother     Past Medical History:  Diagnosis Date  . Chronic kidney disease    Stage 3 sees primary physcian  . GERD (gastroesophageal reflux disease)   . History of kidney stones   . Hyperlipidemia   . Left ureteral calculus   . Prostate cancer [facility])     Past Surgical History:  Procedure Laterality Date  . CHOLECYSTECTOMY N/A 04/04/2019   Procedure: LAPAROSCOPIC CHOLECYSTECTOMY;  Surgeon: Kinsinger, Arta Bruce, MD;  Location: [facility];  Service: General;  Laterality: N/A;  . CYSTOSCOPY WITH RETROGRADE PYELOGRAM, URETEROSCOPY AND STENT PLACEMENT Left 11/30/2012   Procedure: CYSTOSCOPY WITH LEFT RETROGRADE PYELOGRAM, LEFT URETEROSCOPY LEFT STONE EXTRACTION WITH BASKET  AND LEFT STENT PLACEMENT;  Surgeon: Alexis Frock, MD;  Location: [facility];  Service: Urology;  Laterality: Left;  . EXTRACORPOREAL SHOCK WAVE LITHOTRIPSY Left 10-08-2012  . INGUINAL HERNIA REPAIR Right 1990  . LYMPHADENECTOMY Bilateral 11/30/2016   Procedure: PELVIC LYMPHADENECTOMY;  Surgeon: Alexis Frock, MD;  Location: [facility];  Service: Urology;  Laterality: Bilateral;  . ROBOT ASSISTED LAPAROSCOPIC RADICAL PROSTATECTOMY N/A 11/30/2016   Procedure: XI ROBOTIC ASSISTED LAPAROSCOPIC RADICAL PROSTATECTOMY;  Surgeon: Alexis Frock, MD;  Location: [facility];  Service: Urology;  Laterality: N/A;  . WISDOM TOOTH EXTRACTION    . XI ROBOTIC ASSISTED SIMPLE PROSTATECTOMY     Dr. Tresa Moore 11/30/16    Social History:  reports that he has never smoked. He has never used smokeless tobacco. He reports that he does not drink alcohol or use drugs.  Allergies: No Known Allergies  (Not in a hospital admission)    Physical Exam: Blood pressure 129/82, pulse 72, temperature 98.2 F (36.8 C), temperature source Oral, resp. rate 18, SpO2 99 %. General: pleasant, WD, WN white male who is laying in bed in NAD HEENT: head is normocephalic, atraumatic.  Sclera are noninjected.  PERRL.  Ears and nose without any masses or lesions.  Mouth is pink and moist Heart: regular, rate, and rhythm.  Normal s1,s2. No obvious murmurs, gallops, or rubs noted.  Palpable radial and pedal pulses bilaterally Lungs: CTAB, no wheezes, rhonchi, or rales noted.  Respiratory effort nonlabored Abd: soft, mildly bloated, minimal lower abdominal tenderness (has had pain  medication so states it was worse before this),  Some BS, no masses, hernias, or organomegaly.  Lap chole incisions are healing well MS: all 4 extremities are symmetrical with no cyanosis, clubbing, or edema. Skin: warm and dry with no masses, lesions, or rashes Neuro: Cranial nerves 2-12 grossly intact, sensation is normal throughout Psych: A&Ox3 with an appropriate affect.   Results for orders placed or performed during the  hospital encounter of 05/03/19 (from the past 48 hour(s))  Lipase, blood     Status: None   Collection Time: 05/03/19  9:13 AM  Result Value Ref Range   Lipase 25 11 - 51 U/L    Comment: Performed at [facility], [place] [address] [place], [place] [address]  Comprehensive metabolic panel     Status: Abnormal   Collection Time: 05/03/19  9:13 AM  Result Value Ref Range   Sodium 141 135 - 145 mmol/L   Potassium 3.9 3.5 - 5.1 mmol/L   Chloride 109 98 - 111 mmol/L   CO2 23 22 - 32 mmol/L   Glucose, Bld 111 (H) 70 - 99 mg/dL    Comment: Glucose reference range applies only to samples taken after fasting for at least 8 hours.   BUN 12 6 - 20 mg/dL   Creatinine, Ser 1.15 0.61 - 1.24 mg/dL   Calcium 8.9 8.9 - 10.3 mg/dL   Total Protein 6.6 6.5 - 8.1 g/dL   Albumin 3.8 3.5 - 5.0 g/dL   AST 41 15 - 41 U/L   ALT 57 (H) 0 - 44 U/L   Alkaline Phosphatase 64 38 - 126 U/L   Total Bilirubin 1.2 0.3 - 1.2 mg/dL   GFR calc non Af Amer >60 >60 mL/min   GFR calc Af Amer >60 >60 mL/min   Anion gap 9 5 - 15    Comment: Performed at [facility], [place] [address] [place], [place] [address]  CBC     Status: None   Collection Time: 05/03/19  9:13 AM  Result Value Ref Range   WBC 10.5 4.0 - 10.5 K/uL   RBC 5.19 4.22 - 5.81 MIL/uL   Hemoglobin 15.9 13.0 - 17.0 g/dL   HCT 48.1 39.0 - 52.0 %   MCV 92.7 80.0 - 100.0 fL   MCH 30.6 26.0 - 34.0 pg   MCHC 33.1 30.0 - 36.0 g/dL   RDW 13.3 11.5 - 15.5 %   Platelets 258 150 - 400 K/uL   nRBC 0.0 0.0 - 0.2 %    Comment: Performed at [facility], [place] [address], [place], [place] [address]  Urinalysis, Routine w reflex microscopic     Status: None   Collection Time: 05/03/19 11:39 AM  Result Value Ref Range   Color, Urine YELLOW YELLOW   APPearance CLEAR CLEAR   Specific Gravity, Urine 1.024 1.005 - 1.030   pH 5.0 5.0 - 8.0   Glucose, UA NEGATIVE NEGATIVE mg/dL   Hgb urine dipstick NEGATIVE NEGATIVE    Bilirubin Urine NEGATIVE NEGATIVE   Ketones, ur NEGATIVE NEGATIVE mg/dL   Protein, ur NEGATIVE NEGATIVE mg/dL   Nitrite NEGATIVE NEGATIVE   Leukocytes,Ua NEGATIVE NEGATIVE    Comment: Performed at [facility] [address], [place],  [address]  Respiratory Panel by RT PCR (Flu A&B, Covid) - Nasopharyngeal Swab     Status: Abnormal   Collection Time: 05/03/19 12:50 PM   Specimen: Nasopharyngeal Swab  Result Value Ref Range  SARS Coronavirus 2 by RT PCR POSITIVE (A) NEGATIVE    Comment: RESULT CALLED TO, READ BACK BY AND VERIFIED WITH: WEST,S RN @1417  ON 05/03/2019 JACKSON,K (NOTE) SARS-CoV-2 target nucleic acids are DETECTED. SARS-CoV-2 RNA is generally detectable in upper respiratory specimens  during the acute phase of infection. Positive results are indicative of the presence of the identified virus, but do not rule out bacterial infection or co-infection with other pathogens not detected by the test. Clinical correlation with patient history and other diagnostic information is necessary to determine patient infection status. The expected result is Negative. Fact Sheet for Patients:  PinkCheek.be Fact Sheet for Healthcare Providers: GravelBags.it This test is not yet approved or cleared by the [place] FDA and  has been authorized for detection and/or diagnosis of SARS-CoV-2 by FDA under an Emergency Use Authorization (EUA).  This EUA will remain in effect (meaning this test can be used)  for the duration of  the COVID-19 declaration under Section 564(b)(1) of the Act, 21 U.S.C. section 360bbb-3(b)(1), unless the authorization is terminated or revoked sooner.    Influenza A by PCR NEGATIVE NEGATIVE   Influenza B by PCR NEGATIVE NEGATIVE    Comment: (NOTE) The Xpert Xpress SARS-CoV-2/FLU/RSV assay is intended as an aid in  the diagnosis of influenza from Nasopharyngeal swab specimens and   should not be used as a sole basis for treatment. Nasal washings and  aspirates are unacceptable for Xpert Xpress SARS-CoV-2/FLU/RSV  testing. Fact Sheet for Patients: PinkCheek.be Fact Sheet for Healthcare Providers: GravelBags.it This test is not yet approved or cleared by the [place] FDA and  has been authorized for detection and/or diagnosis of SARS-CoV-2 by  FDA under an Emergency Use Authorization (EUA). This EUA will remain  in effect (meaning this test can be used) for the duration of the  Covid-19 declaration under Section 564(b)(1) of the Act, 21  U.S.C. section 360bbb-3(b)(1), unless the authorization is  terminated or revoked. Performed at [facility], [place] [address], [place], [place] [address]    CT Abdomen Pelvis W Contrast  Result Date: 05/03/2019 CLINICAL DATA:  Onset mid abdominal pain last night. Patient status post cholecystectomy approximately 1 month ago. EXAM: CT ABDOMEN AND PELVIS WITH CONTRAST TECHNIQUE: Multidetector CT imaging of the abdomen and pelvis was performed using the standard protocol following bolus administration of intravenous contrast. CONTRAST:  100 mL OMNIPAQUE IOHEXOL 300 MG/ML  SOLN COMPARISON:  CT abdomen and pelvis 10/18/2016. FINDINGS: Lower chest: There is some dependent atelectasis in the lung bases. No pleural or pericardial effusion. Heart size is normal. Hepatobiliary: Status post cholecystectomy. No focal lesion. Mild intrahepatic biliary ductal dilatation is seen in the right hepatic lobe. Bile ducts in the left hepatic lobe in the common bile duct appear normal. Pancreas: Unremarkable. No pancreatic ductal dilatation or surrounding inflammatory changes. Spleen: Normal in size without focal abnormality. Adrenals/Urinary Tract: Adrenal glands are unremarkable. Kidneys are normal, without renal calculi, focal lesion, or hydronephrosis. Bladder is unremarkable.  Stomach/Bowel: There is mild dilatation of small bowel loops up to approximately 3.3 cm with air-fluid levels present. Fecalized small bowel contents are seen in the right lower quadrant where there is an angulated loop and transition to complete decompression of small bowel. The stomach, colon and appendix appear normal. Vascular/Lymphatic: No significant vascular findings are present. No enlarged abdominal or pelvic lymph nodes. Reproductive: Prostate is unremarkable. Other: None. Musculoskeletal: No acute or focal bony abnormality. IMPRESSION: Findings consistent with mild small bowel obstruction with  a transition point identified in the pelvis where a kinked loop of small bowel is identified most consistent with an adhesion. Status post cholecystectomy. Minimal prominence of intrahepatic biliary ducts in the right hepatic lobe may be incidental. Left intrahepatic ducts the common bile duct are normal. Recommend correlation with liver function tests. Electronically Signed   By: Inge Rise M.D.   On: 05/03/2019 12:19      Assessment/Plan CKD, stage 3 HLD H/O prostate cancer, s/p resection and radiation  SBO The patient appears to have a SBO on imaging which has been reviewed by myself.  He has a transition zone in his pelvis which is likely secondary to his prior prostatectomy/radiation.  He has not had any emesis but does have dilated SB on his CT scan with nausea and some distention of his stomach.  We will place an NGT and start the SBO protocol.  We discussed the reasoning behind why we do this and our aim to avoid surgical intervention if able.  If he fails to improve with conservative management then we discussed the possible need for surgical intervention.  He understands and agrees with this plan.   FEN - NPO/IVFs/NGT VTE - ok for chemical prophylaxis from our standpoint ID - none   Henreitta Cea, [facility] 05/03/2019, 2:28 PM Please see Amion for pager  number during day hours 7:00am-4:30pm or 7:00am -11:30am on weekends

## 2019-05-03 NOTE — ED Notes (Signed)
Pt unable to go to 5E, as he had a positive COVID test. Bed to be reassigned.

## 2019-05-03 NOTE — ED Triage Notes (Signed)
Per pt, mid abdominal pain since last night-states he had gallbladder surgery 1 month ago-called MD and was told he might have a stone in his common bile duct and to go to ED for further eval

## 2019-05-04 ENCOUNTER — Inpatient Hospital Stay (HOSPITAL_COMMUNITY): Payer: 59

## 2019-05-04 LAB — BASIC METABOLIC PANEL
Anion gap: 6 (ref 5–15)
BUN: 13 mg/dL (ref 6–20)
CO2: 25 mmol/L (ref 22–32)
Calcium: 8.5 mg/dL — ABNORMAL LOW (ref 8.9–10.3)
Chloride: 110 mmol/L (ref 98–111)
Creatinine, Ser: 1.2 mg/dL (ref 0.61–1.24)
GFR calc Af Amer: >60 mL/min (ref >60)
GFR calc non Af Amer: >60 mL/min (ref >60)
Glucose, Bld: 106 mg/dL — ABNORMAL HIGH (ref 70–99)
Potassium: 3.9 mmol/L (ref 3.5–5.1)
Sodium: 141 mmol/L (ref 135–145)

## 2019-05-04 LAB — CBC
HCT: 41 % (ref 39.0–52.0)
Hemoglobin: 13.1 g/dL (ref 13.0–17.0)
MCH: 30 pg (ref 26.0–34.0)
MCHC: 32 g/dL (ref 30.0–36.0)
MCV: 93.8 fL (ref 80.0–100.0)
Platelets: 214 10*3/uL (ref 150–400)
RBC: 4.37 MIL/uL (ref 4.22–5.81)
RDW: 13.3 % (ref 11.5–15.5)
WBC: 8.2 10*3/uL (ref 4.0–10.5)
nRBC: 0 % (ref 0.0–0.2)

## 2019-05-04 LAB — MAGNESIUM: Magnesium: 1.9 mg/dL (ref 1.7–2.4)

## 2019-05-04 LAB — PHOSPHORUS: Phosphorus: 2.8 mg/dL (ref 2.5–4.6)

## 2019-05-04 MED ORDER — ACETAMINOPHEN 325 MG PO TABS
650.0000 mg | ORAL_TABLET | Freq: Four times a day (QID) | ORAL | Status: DC | PRN
  Administered 2019-05-04: 650 mg via ORAL
  Filled 2019-05-04: qty 2

## 2019-05-04 MED ORDER — PROMETHAZINE HCL 25 MG/ML IJ SOLN: 12.5000 mg | Freq: Four times a day (QID) | INTRAMUSCULAR | Status: DC | PRN

## 2019-05-04 MED ORDER — PHENOL 1.4 % MT LIQD
1.0000 | OROMUCOSAL | Status: DC | PRN
  Filled 2019-05-04: qty 177

## 2019-05-04 NOTE — Progress Notes (Signed)
PROGRESS NOTE  Johnny Burns [id number] DOB: November 28, 1960 DOA: 05/03/2019 PCP: Shirline Frees, MD  HPI/Recap of past 24 hours: Johnny Burns  is a 59 y.o. male, with past medical history of prostate cancer status post prostatectomy, hyperlipidemia, positive COVID-19 on 03/04/19, recent laparoscopic cholecystectomy 04/04/19 who presents to [facility] ED with complaints of abdominal pain, with onset over 24 hours prior to presentation radiating to the back associated with nausea.  Last bowel movement was day prior to presentation now has not been able to have a flatus.  No prior history of SBO.   -In ED CT abdomen pelvis significant for small bowel obstruction with transition point in the pelvis area.  TRH asked to admit.  Assessment/Plan: Active Problems:   SOB (shortness of breath)   SBO (small bowel obstruction) (HCC)  SBO -Patient presents with abdominal pain, nausea, no bowel movement, no flatus for last 24 hours, imaging significant for small bowel obstruction in the pelvic area N.p.o. NG tube placed on 3/5>> Abdominal x-ray 3/6 revealed persistent obstruction Monitor NG tube output Continue IV fluid hydration Continue supportive care Maintain potassium level greater than 4.0 magnesium greater than 2.0 Mobilize as tolerated  Resolved leukocytosis, likely reactive from SBO Afebrile No evidence of active infective process  Recent laparoscopic cholecystectomy No acute issues, incision site looks clean ALT mildly elevated at 57 on presentation  Hyperlipidemia -Currently n.p.o., resume fenofibrate and atorvastatin once able to take oral  History of COVID-19 viral infection Diagnosed on 03/04/2019 Out of the window for 21 day Quarantine Positive COVID-19 test on 05/03/2019. No respiratory symptoms and afebrile  DVT Prophylaxis subcu Heparin 3 times daily- SCDs     Family Communication: We will call family if okay with the patient.  Code Status : Full  code.   Disposition: Patient is from home.  Anticipate discharge to home once SBO has resolved and general surgery has signed off.  Persistent SBO currently and n.p.o. with NG tube to suction.    Consults called: General Surgery    Objective: Vitals:   05/03/19 1500 05/03/19 1539 05/03/19 2040 05/04/19 0559  BP: (!) 104/57 117/79 131/83 109/71  Pulse: (!) 59 60 79 69  Resp:  19 19 20   Temp:  (!) 97.4 F (36.3 C) 98.1 F (36.7 C) 98.1 F (36.7 C)  TempSrc:  Oral Oral Oral  SpO2: 93% 98% 100% 97%    Intake/Output Summary (Last 24 hours) at 05/04/2019 1208 Last data filed at 05/04/2019 0100 Gross per 24 hour  Intake --  Output 200 ml  Net -200 ml   There were no vitals filed for this visit.  Exam:  . General: 59 y.o. year-old male well developed well nourished in no acute distress.  Alert and oriented x3. . Cardiovascular: Regular rate and rhythm with no rubs or gallops.  No thyromegaly or JVD noted.   Marland Kitchen Respiratory: Clear to auscultation with no wheezes or rales. Good inspiratory effort. . Abdomen: Soft nontender hypoactive bowel sounds noted.   . Musculoskeletal: No lower extremity edema. 2/4 pulses in all 4 extremities. Marland Kitchen Psychiatry: Mood is appropriate for condition and setting   Data Reviewed: CBC: Recent Labs  Lab 05/03/19 0913 05/03/19 1632 05/04/19 0541  WBC 10.5 12.1* 8.2  HGB 15.9 15.2 13.1  HCT 48.1 46.3 41.0  MCV 92.7 92.8 93.8  PLT 258 253 [id number]   Basic Metabolic Panel: Recent Labs  Lab 05/03/19 0913 05/03/19 1632 05/04/19 0541  NA 141  --  141  K 3.9  --  3.9  CL 109  --  110  CO2 23  --  25  GLUCOSE 111*  --  106*  BUN 12  --  13  CREATININE 1.15 1.30* 1.20  CALCIUM 8.9  --  8.5*  MG  --   --  1.9  PHOS  --   --  2.8   GFR: CrCl cannot be calculated (Unknown ideal weight.). Liver Function Tests: Recent Labs  Lab 05/03/19 0913  AST 41  ALT 57*  ALKPHOS 64  BILITOT 1.2  PROT 6.6  ALBUMIN 3.8   Recent Labs  Lab  05/03/19 0913  LIPASE 25   No results for input(s): AMMONIA in the last 168 hours. Coagulation Profile: No results for input(s): INR, PROTIME in the last 168 hours. Cardiac Enzymes: No results for input(s): CKTOTAL, CKMB, CKMBINDEX, TROPONINI in the last 168 hours. BNP (last 3 results) No results for input(s): PROBNP in the last 8760 hours. HbA1C: No results for input(s): HGBA1C in the last 72 hours. CBG: No results for input(s): GLUCAP in the last 168 hours. Lipid Profile: No results for input(s): CHOL, HDL, LDLCALC, TRIG, CHOLHDL, LDLDIRECT in the last 72 hours. Thyroid Function Tests: No results for input(s): TSH, T4TOTAL, FREET4, T3FREE, THYROIDAB in the last 72 hours. Anemia Panel: No results for input(s): VITAMINB12, FOLATE, FERRITIN, TIBC, IRON, RETICCTPCT in the last 72 hours. Urine analysis:    Component Value Date/Time   COLORURINE YELLOW 05/03/2019 1139   APPEARANCEUR CLEAR 05/03/2019 1139   LABSPEC 1.024 05/03/2019 1139   PHURINE 5.0 05/03/2019 1139   GLUCOSEU NEGATIVE 05/03/2019 1139   HGBUR NEGATIVE 05/03/2019 1139   BILIRUBINUR NEGATIVE 05/03/2019 1139   KETONESUR NEGATIVE 05/03/2019 1139   PROTEINUR NEGATIVE 05/03/2019 1139   UROBILINOGEN 0.2 10/09/2012 0440   NITRITE NEGATIVE 05/03/2019 1139   LEUKOCYTESUR NEGATIVE 05/03/2019 1139   Sepsis Labs: @LABRCNTIP (procalcitonin:4,lacticidven:4)  ) Recent Results (from the past 240 hour(s))  Respiratory Panel by RT PCR (Flu A&B, Covid) - Nasopharyngeal Swab     Status: Abnormal   Collection Time: 05/03/19 12:50 PM   Specimen: Nasopharyngeal Swab  Result Value Ref Range Status   SARS Coronavirus 2 by RT PCR POSITIVE (A) NEGATIVE Final    Comment: RESULT CALLED TO, READ BACK BY AND VERIFIED WITH: WEST,S RN @1417  ON 05/03/2019 JACKSON,K (NOTE) SARS-CoV-2 target nucleic acids are DETECTED. SARS-CoV-2 RNA is generally detectable in upper respiratory specimens  during the acute phase of infection. Positive results  are indicative of the presence of the identified virus, but do not rule out bacterial infection or co-infection with other pathogens not detected by the test. Clinical correlation with patient history and other diagnostic information is necessary to determine patient infection status. The expected result is Negative. Fact Sheet for Patients:  PinkCheek.be Fact Sheet for Healthcare Providers: GravelBags.it This test is not yet approved or cleared by the [place] FDA and  has been authorized for detection and/or diagnosis of SARS-CoV-2 by FDA under an Emergency Use Authorization (EUA).  This EUA will remain in effect (meaning this test can be used)  for the duration of  the COVID-19 declaration under Section 564(b)(1) of the Act, 21 U.S.C. section 360bbb-3(b)(1), unless the authorization is terminated or revoked sooner.    Influenza A by PCR NEGATIVE NEGATIVE Final   Influenza B by PCR NEGATIVE NEGATIVE Final    Comment: (NOTE) The Xpert Xpress SARS-CoV-2/FLU/RSV assay is intended as an aid in  the diagnosis of influenza from Nasopharyngeal swab specimens and  should  not be used as a sole basis for treatment. Nasal washings and  aspirates are unacceptable for Xpert Xpress SARS-CoV-2/FLU/RSV  testing. Fact Sheet for Patients: PinkCheek.be Fact Sheet for Healthcare Providers: GravelBags.it This test is not yet approved or cleared by the [place] FDA and  has been authorized for detection and/or diagnosis of SARS-CoV-2 by  FDA under an Emergency Use Authorization (EUA). This EUA will remain  in effect (meaning this test can be used) for the duration of the  Covid-19 declaration under Section 564(b)(1) of the Act, 21  U.S.C. section 360bbb-3(b)(1), unless the authorization is  terminated or revoked. Performed at [facility], [place] [address], [place], [place] [address]       Studies: DG Abd Portable 1V-Small Bowel Obstruction Protocol-initial, 8 hr delay  Result Date: 05/04/2019 CLINICAL DATA:  Small-bowel delay. EXAM: PORTABLE ABDOMEN - 1 VIEW COMPARISON:  May 03, 2019 FINDINGS: Again noted are dilated loops of small bowel. The majority of the oral contrast remains within the small bowel. No definite oral contrast visualized within the colon. IV contrast is visualized within the urinary bladder. IMPRESSION: 1. No oral contrast visualized within the colon. 2. Persistent dilated loops of small bowel as seen on prior studies. Electronically Signed   By: Constance Holster M.D.   On: 05/04/2019 00:38   DG Abd Portable 1V-Small Bowel Protocol-Position Verification  Result Date: 05/03/2019 CLINICAL DATA:  NG tube placement. EXAM: PORTABLE ABDOMEN - 1 VIEW COMPARISON:  CT scan same date. FINDINGS: The NG tube tip is in the fundal region of the stomach. Stable mildly distended bowel loops with air-fluid levels. No free air. Contrast noted in the kidneys in ureters from today CT scan. IMPRESSION: NG tube tip is in the fundal region of the stomach. Electronically Signed   By: Marijo Sanes M.D.   On: 05/03/2019 15:23    Scheduled Meds: . heparin  5,000 Units Subcutaneous Q8H    Continuous Infusions: . sodium chloride 100 mL/hr at 05/04/19 0400  . dextrose 5 % and 0.45% NaCl 100 mL/hr at 05/03/19 1249     LOS: 1 day     Kayleen Memos, MD [facility] Pager [phone]  If 7PM-7AM, please contact night-coverage [email or web address] Password [email or web address] 05/04/2019, 12:08 PM

## 2019-05-04 NOTE — Progress Notes (Signed)
Subjective No acute events. Getting up to walk. Feeling much better than when presented. NG tube has had virtually no output. He reports his distention has much improved. He denies any n/v. No flatus/BM yet.  Objective: Vital signs in last 24 hours: Temp:  [97.4 F (36.3 C)-98.2 F (36.8 C)] 98.1 F (36.7 C) (03/06 0559) Pulse Rate:  [59-104] 69 (03/06 0559) Resp:  [18-20] 20 (03/06 0559) BP: (104-138)/(57-97) 109/71 (03/06 0559) SpO2:  [93 %-100 %] 97 % (03/06 0559) Last BM Date: 05/01/19  Intake/Output from previous day: 03/05 0701 - 03/06 0700 In: -  Out: 200 [Urine:200] Intake/Output this shift: No intake/output data recorded.  Gen: NAD, comfortable, wearing mask CV: RRR Pulm: Normal work of breathing Abd: Soft, not significantly distended; nontender. No rebound. No guarding. Ext: SCDs in place  Lab Results: CBC  Recent Labs    05/03/19 1632 05/04/19 0541  WBC 12.1* 8.2  HGB 15.2 13.1  HCT 46.3 41.0  PLT 253 214   BMET Recent Labs    05/03/19 0913 05/03/19 0913 05/03/19 1632 05/04/19 0541  NA 141  --   --  141  K 3.9  --   --  3.9  CL 109  --   --  110  CO2 23  --   --  25  GLUCOSE 111*  --   --  106*  BUN 12  --   --  13  CREATININE 1.15   < > 1.30* 1.20  CALCIUM 8.9  --   --  8.5*   < > = values in this interval not displayed.   PT/INR No results for input(s): LABPROT, INR in the last 72 hours. ABG No results for input(s): PHART, HCO3 in the last 72 hours.  Invalid input(s): PCO2, PO2  Studies/Results:  Anti-infectives: Anti-infectives (From admission, onward)   None       Assessment/Plan: Patient Active Problem List   Diagnosis Date Noted  . SOB (shortness of breath) 05/03/2019  . SBO (small bowel obstruction) ([place]) 05/03/2019  . Yeast dermatitis 07/20/2017  . Prostate cancer ([place]) 11/30/2016   CKD, stage 3 HLD H/O prostate cancer, s/p resection and radiation  SBO He has had minimal clear/light brown output from ng -  certainly not bilious. Clinically not distended with reassuring abdominal exam. WBC normalized Given clinical picture and improvement without any real NG output, will attempt clamping trial and give sips of clears and see how things go   LOS: 1 day   Sharon Mt. Dema Severin, M.D. [facility] Use [email or web address] to contact on call provider

## 2019-05-04 NOTE — Progress Notes (Signed)
Pt tolerated clear liquids upon clamping of NGT. Pt has been ambulating in halls. Per MD NGT was removed 6 hours after initial clamping of NGT. Will continue to monitor.

## 2019-05-05 LAB — BASIC METABOLIC PANEL
Anion gap: 6 (ref 5–15)
BUN: 11 mg/dL (ref 6–20)
CO2: 26 mmol/L (ref 22–32)
Calcium: 8.3 mg/dL — ABNORMAL LOW (ref 8.9–10.3)
Chloride: 108 mmol/L (ref 98–111)
Creatinine, Ser: 1.12 mg/dL (ref 0.61–1.24)
GFR calc Af Amer: >60 mL/min (ref >60)
GFR calc non Af Amer: >60 mL/min (ref >60)
Glucose, Bld: 91 mg/dL (ref 70–99)
Potassium: 3.6 mmol/L (ref 3.5–5.1)
Sodium: 140 mmol/L (ref 135–145)

## 2019-05-05 LAB — MAGNESIUM: Magnesium: 1.9 mg/dL (ref 1.7–2.4)

## 2019-05-05 MED ORDER — POTASSIUM CHLORIDE CRYS ER 20 MEQ PO TBCR: 20.0000 meq | EXTENDED_RELEASE_TABLET | Freq: Every day | ORAL | 0 refills | Status: DC

## 2019-05-05 MED ORDER — POTASSIUM CHLORIDE CRYS ER 20 MEQ PO TBCR
40.0000 meq | EXTENDED_RELEASE_TABLET | Freq: Once | ORAL | Status: AC
  Administered 2019-05-05: 40 meq via ORAL
  Filled 2019-05-05: qty 2

## 2019-05-05 NOTE — Discharge Summary (Signed)
Discharge Summary  Johnny Burns [id number] DOB: 1961-01-11  PCP: Shirline Frees, MD  Admit date: 05/03/2019 Discharge date: 05/05/2019  Time spent: 35 minutes   Recommendations for Outpatient Follow-up:  1.  Follow with your primary care provider. 2. Follow-up with general surgery. 3. Take your medications as prescribed.  Discharge Diagnoses:  Active Hospital Problems   Diagnosis Date Noted  . SOB (shortness of breath) 05/03/2019  . SBO (small bowel obstruction) ([place]) 05/03/2019    Resolved Hospital Problems  No resolved problems to display.    Discharge Condition: Stable.  Diet recommendation: Resume previous diet.  Vitals:   05/04/19 1955 05/05/19 0547  BP: 104/76 115/74  Pulse: 88 64  Resp: 20 18  Temp: 98.2 F (36.8 C) 97.9 F (36.6 C)  SpO2: 100% 98%    History of present illness:  WallaceUnderwoodis a59 y.o.male,with past medical history of prostate cancer status post prostatectomy, hyperlipidemia, positive COVID-19 on 03/04/19, recent laparoscopic cholecystectomy 04/04/19 who presents to [facility] ED with complaints of abdominal pain, with onset over 24 hours prior to presentation radiating to the back associated with nausea.  Last bowel movement was day prior to presentation now has not been able to have a flatus.  No prior history of SBO.   -In ED CT abdomen pelvis significant for small bowel obstruction with transition point in the pelvis area.  TRH asked to admit.  05/05/19: Seen and examined.  NG tube was removed yesterday and has tolerated a liquid diet with advancement to soft diet.  Tolerated a soft diet.  No nausea.  No abdominal pain.  Had 1 bowel movement yesterday and one this morning.  He is eager to go home.  Okay to discharge from a surgical standpoint.  Hospital Course:  Active Problems:   SOB (shortness of breath)   SBO (small bowel obstruction) (H[facility])  SBO, appears to have resolved. -Patient presents with abdominal pain, nausea, no bowel  movement, no flatus for last 24 hours, imaging significant for small bowel obstruction in the pelvic area NG tube placed on 3/5>> 05/04/19 Tolerated soft diet. Denies nausea, vomiting, abdominal pain, had a bowel movement this morning.  Resolved leukocytosis, likely reactive from SBO Afebrile No evidence of active infective process  Recent laparoscopic cholecystectomy No acute issues, incision site looks clean ALT mildly elevated at 57 on presentation  Hyperlipidemia -Resume home medications  History of COVID-19 viral infection Diagnosed on 03/04/2019 Out of the window for 21 day Quarantine Positive COVID-19 test on 05/03/2019. No respiratory symptoms and afebrile   Code Status: Full code.    Consults called:General Surgery   Procedures:  NG tube placement on 05/03/2019.  Consultations:  General surgery.  Discharge Exam: BP 115/74 (BP Location: Left Arm)   Pulse 64   Temp 97.9 F (36.6 C) (Oral)   Resp 18   SpO2 98%  . General: 59 y.o. year-old male well developed well nourished in no acute distress.  Alert and oriented x3. . Cardiovascular: Regular rate and rhythm with no rubs or gallops.  No thyromegaly or JVD noted.   Marland Kitchen Respiratory: Clear to auscultation with no wheezes or rales. Good inspiratory effort. . Abdomen: Soft nontender nondistended with normal bowel sounds x4 quadrants. . Musculoskeletal: No lower extremity edema. 2/4 pulses in all 4 extremities. Marland Kitchen Psychiatry: Mood is appropriate for condition and setting  Discharge Instructions You were cared for by a hospitalist during your hospital stay. If you have any questions about your discharge medications or the care you received while  you were in the hospital after you are discharged, you can call the unit and asked to speak with the hospitalist on call if the hospitalist that took care of you is not available. Once you are discharged, your primary care physician will handle any further medical issues.  Please note that NO REFILLS for any discharge medications will be authorized once you are discharged, as it is imperative that you return to your primary care physician (or establish a relationship with a primary care physician if you do not have one) for your aftercare needs so that they can reassess your need for medications and monitor your lab values.   Allergies as of 05/05/2019   No Known Allergies     Medication List    STOP taking these medications   HYDROcodone-acetaminophen 5-325 MG tablet Commonly known as: NORCO/VICODIN   ibuprofen 800 MG tablet Commonly known as: ADVIL     TAKE these medications   atorvastatin 10 MG tablet Commonly known as: LIPITOR Take 10 mg by mouth every evening.   cetirizine 10 MG tablet Commonly known as: ZYRTEC Take 10 mg by mouth at bedtime as needed for allergies.   fenofibrate 160 MG tablet Take 160 mg by mouth every evening.   pantoprazole 40 MG tablet Commonly known as: PROTONIX Take 40 mg by mouth at bedtime.   polyvinyl alcohol 1.4 % ophthalmic solution Commonly known as: LIQUIFILM TEARS Place 1 drop into both eyes as needed for dry eyes.   potassium chloride SA 20 MEQ tablet Commonly known as: KLOR-CON Take 1 tablet (20 mEq total) by mouth daily for 5 days.   vitamin C 1000 MG tablet Take 1,000 mg by mouth daily.   ZINC PO Take 1 tablet by mouth daily.      No Known Allergies Follow-up Information    Shirline Frees, MD. Call in 1 day(s).   Specialty: Family Medicine Why: Please call for a post hospital follow up appointment Contact information: [place] [address] [phone]        Armandina Gemma, MD. Call in 1 day(s).   Specialty: General Surgery Why: Please call for a post hospital follow up appointment Contact information: [place] [address] [phone]            The results of significant diagnostics from this hospitalization  (including imaging, microbiology, ancillary and laboratory) are listed below for reference.    Significant Diagnostic Studies: CT Abdomen Pelvis W Contrast  Result Date: 05/03/2019 CLINICAL DATA:  Onset mid abdominal pain last night. Patient status post cholecystectomy approximately 1 month ago. EXAM: CT ABDOMEN AND PELVIS WITH CONTRAST TECHNIQUE: Multidetector CT imaging of the abdomen and pelvis was performed using the standard protocol following bolus administration of intravenous contrast. CONTRAST:  100 mL OMNIPAQUE IOHEXOL 300 MG/ML  SOLN COMPARISON:  CT abdomen and pelvis 10/18/2016. FINDINGS: Lower chest: There is some dependent atelectasis in the lung bases. No pleural or pericardial effusion. Heart size is normal. Hepatobiliary: Status post cholecystectomy. No focal lesion. Mild intrahepatic biliary ductal dilatation is seen in the right hepatic lobe. Bile ducts in the left hepatic lobe in the common bile duct appear normal. Pancreas: Unremarkable. No pancreatic ductal dilatation or surrounding inflammatory changes. Spleen: Normal in size without focal abnormality. Adrenals/Urinary Tract: Adrenal glands are unremarkable. Kidneys are normal, without renal calculi, focal lesion, or hydronephrosis. Bladder is unremarkable. Stomach/Bowel: There is mild dilatation of small bowel loops up to approximately 3.3 cm  with air-fluid levels present. Fecalized small bowel contents are seen in the right lower quadrant where there is an angulated loop and transition to complete decompression of small bowel. The stomach, colon and appendix appear normal. Vascular/Lymphatic: No significant vascular findings are present. No enlarged abdominal or pelvic lymph nodes. Reproductive: Prostate is unremarkable. Other: None. Musculoskeletal: No acute or focal bony abnormality. IMPRESSION: Findings consistent with mild small bowel obstruction with a transition point identified in the pelvis where a kinked loop of small bowel is  identified most consistent with an adhesion. Status post cholecystectomy. Minimal prominence of intrahepatic biliary ducts in the right hepatic lobe may be incidental. Left intrahepatic ducts the common bile duct are normal. Recommend correlation with liver function tests. Electronically Signed   By: Inge Rise M.D.   On: 05/03/2019 12:19   DG Abd Portable 1V-Small Bowel Obstruction Protocol-initial, 8 hr delay  Result Date: 05/04/2019 CLINICAL DATA:  Small-bowel delay. EXAM: PORTABLE ABDOMEN - 1 VIEW COMPARISON:  May 03, 2019 FINDINGS: Again noted are dilated loops of small bowel. The majority of the oral contrast remains within the small bowel. No definite oral contrast visualized within the colon. IV contrast is visualized within the urinary bladder. IMPRESSION: 1. No oral contrast visualized within the colon. 2. Persistent dilated loops of small bowel as seen on prior studies. Electronically Signed   By: Constance Holster M.D.   On: 05/04/2019 00:38   DG Abd Portable 1V-Small Bowel Protocol-Position Verification  Result Date: 05/03/2019 CLINICAL DATA:  NG tube placement. EXAM: PORTABLE ABDOMEN - 1 VIEW COMPARISON:  CT scan same date. FINDINGS: The NG tube tip is in the fundal region of the stomach. Stable mildly distended bowel loops with air-fluid levels. No free air. Contrast noted in the kidneys in ureters from today CT scan. IMPRESSION: NG tube tip is in the fundal region of the stomach. Electronically Signed   By: Marijo Sanes M.D.   On: 05/03/2019 15:23    Microbiology: Recent Results (from the past 240 hour(s))  Respiratory Panel by RT PCR (Flu A&B, Covid) - Nasopharyngeal Swab     Status: Abnormal   Collection Time: 05/03/19 12:50 PM   Specimen: Nasopharyngeal Swab  Result Value Ref Range Status   SARS Coronavirus 2 by RT PCR POSITIVE (A) NEGATIVE Final    Comment: RESULT CALLED TO, READ BACK BY AND VERIFIED WITH: WEST,S RN @1417  ON 05/03/2019 JACKSON,K (NOTE) SARS-CoV-2 target  nucleic acids are DETECTED. SARS-CoV-2 RNA is generally detectable in upper respiratory specimens  during the acute phase of infection. Positive results are indicative of the presence of the identified virus, but do not rule out bacterial infection or co-infection with other pathogens not detected by the test. Clinical correlation with patient history and other diagnostic information is necessary to determine patient infection status. The expected result is Negative. Fact Sheet for Patients:  PinkCheek.be Fact Sheet for Healthcare Providers: GravelBags.it This test is not yet approved or cleared by the [place] FDA and  has been authorized for detection and/or diagnosis of SARS-CoV-2 by FDA under an Emergency Use Authorization (EUA).  This EUA will remain in effect (meaning this test can be used)  for the duration of  the COVID-19 declaration under Section 564(b)(1) of the Act, 21 U.S.C. section 360bbb-3(b)(1), unless the authorization is terminated or revoked sooner.    Influenza A by PCR NEGATIVE NEGATIVE Final   Influenza B by PCR NEGATIVE NEGATIVE Final    Comment: (NOTE) The Xpert Xpress SARS-CoV-2/FLU/RSV assay is  intended as an aid in  the diagnosis of influenza from Nasopharyngeal swab specimens and  should not be used as a sole basis for treatment. Nasal washings and  aspirates are unacceptable for Xpert Xpress SARS-CoV-2/FLU/RSV  testing. Fact Sheet for Patients: PinkCheek.be Fact Sheet for Healthcare Providers: GravelBags.it This test is not yet approved or cleared by the [place] FDA and  has been authorized for detection and/or diagnosis of SARS-CoV-2 by  FDA under an Emergency Use Authorization (EUA). This EUA will remain  in effect (meaning this test can be used) for the duration of the  Covid-19 declaration under Section 564(b)(1) of the Act,  21  U.S.C. section 360bbb-3(b)(1), unless the authorization is  terminated or revoked. Performed at [facility], [place] [address] [place], [place] [address]      Labs: Basic Metabolic Panel: Recent Labs  Lab 05/03/19 0913 05/03/19 1632 05/04/19 0541 05/05/19 0538  NA 141  --  141 140  K 3.9  --  3.9 3.6  CL 109  --  110 108  CO2 23  --  25 26  GLUCOSE 111*  --  106* 91  BUN 12  --  13 11  CREATININE 1.15 1.30* 1.20 1.12  CALCIUM 8.9  --  8.5* 8.3*  MG  --   --  1.9 1.9  PHOS  --   --  2.8  --    Liver Function Tests: Recent Labs  Lab 05/03/19 0913  AST 41  ALT 57*  ALKPHOS 64  BILITOT 1.2  PROT 6.6  ALBUMIN 3.8   Recent Labs  Lab 05/03/19 0913  LIPASE 25   No results for input(s): AMMONIA in the last 168 hours. CBC: Recent Labs  Lab 05/03/19 0913 05/03/19 1632 05/04/19 0541  WBC 10.5 12.1* 8.2  HGB 15.9 15.2 13.1  HCT 48.1 46.3 41.0  MCV 92.7 92.8 93.8  PLT 258 253 214   Cardiac Enzymes: No results for input(s): CKTOTAL, CKMB, CKMBINDEX, TROPONINI in the last 168 hours. BNP: BNP (last 3 results) No results for input(s): BNP in the last 8760 hours.  ProBNP (last 3 results) No results for input(s): PROBNP in the last 8760 hours.  CBG: No results for input(s): GLUCAP in the last 168 hours.     Signed:  Kayleen Memos, MD [facility] 05/05/2019, 1:53 PM

## 2019-05-05 NOTE — Progress Notes (Signed)
Pt being discharged to home with wife. Discharge information and medication education provided to pt.

## 2019-05-05 NOTE — Discharge Instructions (Signed)
Bowel Obstruction °A bowel obstruction means that something is blocking the small or large bowel. The bowel is also called the intestine. It is the long tube that connects the stomach to the opening of the butt (anus). When something blocks the bowel, food and fluids cannot pass through like normal. This condition needs to be treated. Treatment depends on the cause of the problem and how bad the problem is. °What are the causes? °Common causes of this condition include: °· Scar tissue (adhesions) from past surgery or from high-energy X-rays (radiation). °· Recent surgery in the belly. This affects how food moves in the bowel. °· Some diseases, such as: °? Irritation of the lining of the digestive tract (Crohn's disease). °? Irritation of small pouches in the bowel (diverticulitis). °· Growths or tumors. °· A bulging organ (hernia). °· Twisting of the bowel (volvulus). °· A foreign body. °· Slipping of a part of the bowel into another part (intussusception). °What are the signs or symptoms? °Symptoms of this condition include: °· Pain in the belly. °· Feeling sick to your stomach (nauseous). °· Throwing up (vomiting). °· Bloating in the belly. °· Being unable to pass gas. °· Trouble pooping (constipation). °· Watery poop (diarrhea). °· A lot of belching. °How is this diagnosed? °This condition may be diagnosed based on: °· A physical exam. °· Medical history. °· Imaging tests, such as X-ray or CT scan. °· Blood tests. °· Urine tests. °How is this treated? °Treatment for this condition may include: °· Fluids and pain medicines that are given through an IV tube. Your doctor may tell you not to eat or drink if you feel sick to your stomach and are throwing up. °· Eating a clear liquid diet for a few days. °· Putting a small tube (nasogastric tube) into the stomach. This will help with pain, discomfort, and nausea by removing blocked air and fluids from the stomach. °· Surgery. This may be needed if other treatments do  not work. °Follow these instructions at home: °Medicines °· Take over-the-counter and prescription medicines only as told by your doctor. °· If you were prescribed an antibiotic medicine, take it as told by your doctor. Do not stop taking the antibiotic even if you start to feel better. °General instructions °· Follow your diet as told by your doctor. You may need to: °? Only drink clear liquids until you start to get better. °? Avoid solid foods. °· Return to your normal activities as told by your doctor. Ask your doctor what activities are safe for you. °· Do not sit for a long time without moving. Get up to take short walks every 1-2 hours. This is important. Ask for help if you feel weak or unsteady. °· Keep all follow-up visits as told by your doctor. This is important. °How is this prevented? °After having a bowel obstruction, you may be more likely to have another. You can do some things to stop it from happening again. °· If you have a long-term (chronic) disease, contact your doctor if you see changes or problems. °· Take steps to prevent or treat trouble pooping. Your doctor may ask that you: °? Drink enough fluid to keep your pee (urine) pale yellow. °? Take over-the-counter or prescription medicines. °? Eat foods that are high in fiber. These include beans, whole grains, and fresh fruits and vegetables. °? Limit foods that are high in fat and sugar. These include fried or sweet foods. °· Stay active. Ask your doctor which exercises are   safe for you. °· Avoid stress. °· Eat three small meals and three small snacks each day. °· Work with a food expert (dietitian) to make a meal plan that works for you. °· Do not use any products that contain nicotine or tobacco, such as cigarettes and e-cigarettes. If you need help quitting, ask your doctor. °Contact a doctor if: °· You have a fever. °· You have chills. °Get help right away if: °· You have pain or cramps that get worse. °· You throw up blood. °· You are  sick to your stomach. °· You cannot stop throwing up. °· You cannot drink fluids. °· You feel mixed up (confused). °· You feel very thirsty (dehydrated). °· Your belly gets more bloated. °· You feel weak or you pass out (faint). °Summary °· A bowel obstruction means that something is blocking the small or large bowel. °· Treatment may include IV fluids and pain medicine. You may also have a clear liquid diet, a small tube in your stomach, or surgery. °· Drink clear liquids and avoid solid foods until you get better. °This information is not intended to replace advice given to you by your health care provider. Make sure you discuss any questions you have with your health care provider. °Document Revised: 06/28/2017 Document Reviewed: 06/28/2017 °Elsevier Patient Education © 2020 [employer] Inc. ° °

## 2019-05-05 NOTE — Progress Notes (Signed)
Subjective NG out; tolerating diet; having flatus/BM. Distention resolved. Denies pain  Objective: Vital signs in last 24 hours: Temp:  [97.9 F (36.6 C)-98.2 F (36.8 C)] 97.9 F (36.6 C) (03/07 0547) Pulse Rate:  [64-88] 64 (03/07 0547) Resp:  [18-20] 18 (03/07 0547) BP: (104-126)/(74-80) 115/74 (03/07 0547) SpO2:  [98 %-100 %] 98 % (03/07 0547) Last BM Date: 05/04/19  Intake/Output from previous day: 03/06 0701 - 03/07 0700 In: 1199.1 [I.V.:1199.1] Out: -  Intake/Output this shift: No intake/output data recorded.  Gen: NAD, comfortable, wearing mask CV: RRR Pulm: Normal work of breathing Abd: Soft, not significantly distended; nontender. No rebound. No guarding. Ext: SCDs in place  Lab Results: CBC  Recent Labs    05/03/19 1632 05/04/19 0541  WBC 12.1* 8.2  HGB 15.2 13.1  HCT 46.3 41.0  PLT 253 214   BMET Recent Labs    05/04/19 0541 05/05/19 0538  NA 141 140  K 3.9 3.6  CL 110 108  CO2 25 26  GLUCOSE 106* 91  BUN 13 11  CREATININE 1.20 1.12  CALCIUM 8.5* 8.3*   PT/INR No results for input(s): LABPROT, INR in the last 72 hours. ABG No results for input(s): PHART, HCO3 in the last 72 hours.  Invalid input(s): PCO2, PO2  Studies/Results:  Anti-infectives: Anti-infectives (From admission, onward)   None       Assessment/Plan: Patient Active Problem List   Diagnosis Date Noted  . SOB (shortness of breath) 05/03/2019  . SBO (small bowel obstruction) ([place]) 05/03/2019  . Yeast dermatitis 07/20/2017  . Prostate cancer ([place]) 11/30/2016   CKD, stage 3 HLD H/O prostate cancer, s/p resection and radiation  SBO  -Diet as tolerated -SBO appears to have resolved -[place] for discharge home from our perspective   LOS: 2 days   Sharon Mt. Dema Severin, M.D. [facility] Use [email or web address] to contact on call provider

## 2019-09-01 ENCOUNTER — Emergency Department (HOSPITAL_COMMUNITY): Payer: 59

## 2019-09-01 ENCOUNTER — Other Ambulatory Visit: Payer: Self-pay

## 2019-09-01 ENCOUNTER — Inpatient Hospital Stay (HOSPITAL_COMMUNITY)
Admission: EM | Admit: 2019-09-01 | Discharge: 2019-09-03 | DRG: 390 | Disposition: A | Discharge status: Home / Self Care | Payer: 59 | Attending: Internal Medicine | Admitting: Internal Medicine

## 2019-09-01 ENCOUNTER — Encounter (HOSPITAL_COMMUNITY): Payer: Self-pay | Admitting: *Deleted

## 2019-09-01 DIAGNOSIS — C61 Malignant neoplasm of prostate: Secondary | ICD-10-CM | POA: Diagnosis present

## 2019-09-01 DIAGNOSIS — Z8546 Personal history of malignant neoplasm of prostate: Secondary | ICD-10-CM | POA: Diagnosis not present

## 2019-09-01 DIAGNOSIS — N1831 Chronic kidney disease, stage 3a: Secondary | ICD-10-CM | POA: Diagnosis present

## 2019-09-01 DIAGNOSIS — Z8616 Personal history of COVID-19: Secondary | ICD-10-CM

## 2019-09-01 DIAGNOSIS — K219 Gastro-esophageal reflux disease without esophagitis: Secondary | ICD-10-CM | POA: Diagnosis present

## 2019-09-01 DIAGNOSIS — Z0189 Encounter for other specified special examinations: Secondary | ICD-10-CM

## 2019-09-01 DIAGNOSIS — K760 Fatty (change of) liver, not elsewhere classified: Secondary | ICD-10-CM | POA: Diagnosis present

## 2019-09-01 DIAGNOSIS — Z9049 Acquired absence of other specified parts of digestive tract: Secondary | ICD-10-CM | POA: Diagnosis not present

## 2019-09-01 DIAGNOSIS — E162 Hypoglycemia, unspecified: Secondary | ICD-10-CM | POA: Diagnosis present

## 2019-09-01 DIAGNOSIS — E785 Hyperlipidemia, unspecified: Secondary | ICD-10-CM | POA: Diagnosis present

## 2019-09-01 DIAGNOSIS — Z9079 Acquired absence of other genital organ(s): Secondary | ICD-10-CM

## 2019-09-01 DIAGNOSIS — Z4659 Encounter for fitting and adjustment of other gastrointestinal appliance and device: Secondary | ICD-10-CM

## 2019-09-01 DIAGNOSIS — K56609 Unspecified intestinal obstruction, unspecified as to partial versus complete obstruction: Principal | ICD-10-CM | POA: Diagnosis present

## 2019-09-01 LAB — CBC
HCT: 43.7 % (ref 39.0–52.0)
HCT: 41.9 % (ref 39.0–52.0)
Hemoglobin: 15.2 g/dL (ref 13.0–17.0)
Hemoglobin: 14.3 g/dL (ref 13.0–17.0)
MCH: 31.9 pg (ref 26.0–34.0)
MCH: 31.6 pg (ref 26.0–34.0)
MCHC: 34.8 g/dL (ref 30.0–36.0)
MCHC: 34.1 g/dL (ref 30.0–36.0)
MCV: 91.6 fL (ref 80.0–100.0)
MCV: 92.5 fL (ref 80.0–100.0)
Platelets: 247 10*3/uL (ref 150–400)
Platelets: 223 10*3/uL (ref 150–400)
RBC: 4.77 MIL/uL (ref 4.22–5.81)
RBC: 4.53 MIL/uL (ref 4.22–5.81)
RDW: 12.8 % (ref 11.5–15.5)
RDW: 13 % (ref 11.5–15.5)
WBC: 9.3 10*3/uL (ref 4.0–10.5)
WBC: 8.5 10*3/uL (ref 4.0–10.5)
nRBC: 0 % (ref 0.0–0.2)
nRBC: 0 % (ref 0.0–0.2)

## 2019-09-01 LAB — COMPREHENSIVE METABOLIC PANEL
ALT: 29 U/L (ref 0–44)
AST: 29 U/L (ref 15–41)
Albumin: 4.2 g/dL (ref 3.5–5.0)
Alkaline Phosphatase: 64 U/L (ref 38–126)
Anion gap: 7 (ref 5–15)
BUN: 13 mg/dL (ref 8–23)
CO2: 26 mmol/L (ref 22–32)
Calcium: 8.9 mg/dL (ref 8.9–10.3)
Chloride: 105 mmol/L (ref 98–111)
Creatinine, Ser: 1.21 mg/dL (ref 0.61–1.24)
GFR calc Af Amer: >60 mL/min (ref >60)
GFR calc non Af Amer: >60 mL/min (ref >60)
Glucose, Bld: 98 mg/dL (ref 70–99)
Potassium: 4.3 mmol/L (ref 3.5–5.1)
Sodium: 138 mmol/L (ref 135–145)
Total Bilirubin: 0.9 mg/dL (ref 0.3–1.2)
Total Protein: 6.9 g/dL (ref 6.5–8.1)

## 2019-09-01 LAB — URINALYSIS, ROUTINE W REFLEX MICROSCOPIC
Bilirubin Urine: NEGATIVE
Glucose, UA: NEGATIVE mg/dL
Hgb urine dipstick: NEGATIVE
Ketones, ur: NEGATIVE mg/dL
Leukocytes,Ua: NEGATIVE
Nitrite: NEGATIVE
Protein, ur: NEGATIVE mg/dL
Specific Gravity, Urine: 1.021 (ref 1.005–1.030)
pH: 6 (ref 5.0–8.0)

## 2019-09-01 LAB — LIPASE, BLOOD: Lipase: 26 U/L (ref 11–51)

## 2019-09-01 LAB — BASIC METABOLIC PANEL
Anion gap: 8 (ref 5–15)
BUN: 12 mg/dL (ref 8–23)
CO2: 22 mmol/L (ref 22–32)
Calcium: 8.6 mg/dL — ABNORMAL LOW (ref 8.9–10.3)
Chloride: 108 mmol/L (ref 98–111)
Creatinine, Ser: 1.14 mg/dL (ref 0.61–1.24)
GFR calc Af Amer: >60 mL/min (ref >60)
GFR calc non Af Amer: >60 mL/min (ref >60)
Glucose, Bld: 87 mg/dL (ref 70–99)
Potassium: 4 mmol/L (ref 3.5–5.1)
Sodium: 138 mmol/L (ref 135–145)

## 2019-09-01 LAB — CREATININE, SERUM
Creatinine, Ser: 1.28 mg/dL — ABNORMAL HIGH (ref 0.61–1.24)
GFR calc Af Amer: >60 mL/min (ref >60)
GFR calc non Af Amer: >60 mL/min (ref >60)

## 2019-09-01 LAB — SARS CORONAVIRUS 2 BY RT PCR (HOSPITAL ORDER, PERFORMED IN ~~LOC~~ HOSPITAL LAB): SARS Coronavirus 2: NEGATIVE

## 2019-09-01 MED ORDER — IOHEXOL 300 MG/ML  SOLN
100.0000 mL | Freq: Once | INTRAMUSCULAR | Status: AC | PRN
  Administered 2019-09-01: 100 mL via INTRAVENOUS

## 2019-09-01 MED ORDER — SODIUM CHLORIDE 0.9 % IV SOLN: INTRAVENOUS | Status: DC

## 2019-09-01 MED ORDER — MORPHINE SULFATE (PF) 2 MG/ML IV SOLN
2.0000 mg | INTRAVENOUS | Status: DC | PRN
  Administered 2019-09-02 (×6): 2 mg via INTRAVENOUS
  Filled 2019-09-01 (×6): qty 1

## 2019-09-01 MED ORDER — MORPHINE SULFATE (PF) 4 MG/ML IV SOLN
4.0000 mg | Freq: Once | INTRAVENOUS | Status: AC
  Administered 2019-09-01: 4 mg via INTRAVENOUS
  Filled 2019-09-01: qty 1

## 2019-09-01 MED ORDER — ONDANSETRON HCL 4 MG/2ML IJ SOLN
4.0000 mg | Freq: Four times a day (QID) | INTRAMUSCULAR | Status: DC | PRN
  Administered 2019-09-02: 4 mg via INTRAVENOUS
  Filled 2019-09-01: qty 2

## 2019-09-01 MED ORDER — ENOXAPARIN SODIUM 40 MG/0.4ML ~~LOC~~ SOLN: 40.0000 mg | SUBCUTANEOUS | Status: DC

## 2019-09-01 MED ORDER — LIDOCAINE HCL URETHRAL/MUCOSAL 2 % EX GEL
1.0000 "application " | Freq: Once | CUTANEOUS | Status: AC
  Administered 2019-09-01: 1
  Filled 2019-09-01: qty 11

## 2019-09-01 MED ORDER — ONDANSETRON HCL 4 MG/2ML IJ SOLN
4.0000 mg | Freq: Once | INTRAMUSCULAR | Status: AC
  Administered 2019-09-01: 4 mg via INTRAVENOUS
  Filled 2019-09-01: qty 2

## 2019-09-01 MED ORDER — ONDANSETRON HCL 4 MG PO TABS: 4.0000 mg | ORAL_TABLET | Freq: Four times a day (QID) | ORAL | Status: DC | PRN

## 2019-09-01 MED ORDER — SODIUM CHLORIDE 0.9 % IV BOLUS
500.0000 mL | Freq: Once | INTRAVENOUS | Status: AC
  Administered 2019-09-01: 500 mL via INTRAVENOUS

## 2019-09-01 MED ORDER — SODIUM CHLORIDE (PF) 0.9 % IJ SOLN
INTRAMUSCULAR | Status: AC
  Filled 2019-09-01: qty 50

## 2019-09-01 MED ORDER — SODIUM CHLORIDE 0.9% FLUSH
3.0000 mL | Freq: Once | INTRAVENOUS | Status: AC
  Administered 2019-09-01: 3 mL via INTRAVENOUS

## 2019-09-01 NOTE — H&P (Signed)
History and Physical    Johnny Burns PZW:[id number] DOB: 27-Jun-1960 DOA: 09/01/2019  PCP: Shirline Frees, MD  Patient coming from: home   I have personally briefly reviewed patient's old medical records in [place]  Chief Complaint: worsening stomach pain. "feels the same as the last time I had a bowel obstruction."   HPI: Johnny Burns is a 59 y.o. male with medical history significant of prostate cancer s/p prostatectomy and hyperlipidemia. He states early this AM he started to have diffuse stomach pain that got progressively worse as the day went on. Pain is diffuse and rated as a 7/10 and described as "grabbing and cramping" with intermittent severity. Nothing makes it better or worse. He had a few peanut butter crackers this AM, but that is all he could eat. He denies any nausea/vomiting or diarrhea. He denies any blood in his stool. Had a small BM this AM and has been passing gas throughout the day.  No fever/chills. No shortness of breath or cough.   He has had no other recent surgeries since his SBO in March of this year. Was admitted here for SBO in pelvis area in march. NG tube was placed and patient was managed with conservative therapy with resolution.   Has not had covid vaccines, but has had covid infection.   ED Course: vitals stable on arrival. Labs all unremarkable. CT of abdo/pelvis showed small bowel obstruction with transition in the right lower abdomen similar to prior CT and fatty liver. He does have thickening of the distal and terminal ilieum. NG tube place  With abdo xray to verify placement.   Review of Systems: As per HPI otherwise all other systems reviewed and are negative.   Past Medical History:  Diagnosis Date  . Chronic kidney disease    Stage 3 sees primary physcian  . GERD (gastroesophageal reflux disease)   . History of kidney stones   . Hyperlipidemia   . Left ureteral calculus   . Prostate cancer [facility])     Past Surgical History:    Procedure Laterality Date  . CHOLECYSTECTOMY N/A 04/04/2019   Procedure: LAPAROSCOPIC CHOLECYSTECTOMY;  Surgeon: Kinsinger, Arta Bruce, MD;  Location: [facility];  Service: General;  Laterality: N/A;  . CYSTOSCOPY WITH RETROGRADE PYELOGRAM, URETEROSCOPY AND STENT PLACEMENT Left 11/30/2012   Procedure: CYSTOSCOPY WITH LEFT RETROGRADE PYELOGRAM, LEFT URETEROSCOPY LEFT STONE EXTRACTION WITH BASKET  AND LEFT STENT PLACEMENT;  Surgeon: Alexis Frock, MD;  [facility];  Service: Urology;  Laterality: Left;  . EXTRACORPOREAL SHOCK WAVE LITHOTRIPSY Left 10-08-2012  . INGUINAL HERNIA REPAIR Right 1990  . LYMPHADENECTOMY Bilateral 11/30/2016   Procedure: PELVIC LYMPHADENECTOMY;  Surgeon: Alexis Frock, MD;  Location: [facility];  Service: Urology;  Laterality: Bilateral;  . ROBOT ASSISTED LAPAROSCOPIC RADICAL PROSTATECTOMY N/A 11/30/2016   Procedure: XI ROBOTIC ASSISTED LAPAROSCOPIC RADICAL PROSTATECTOMY;  Surgeon: Alexis Frock, MD;  Location: [facility];  Service: Urology;  Laterality: N/A;  . WISDOM TOOTH EXTRACTION    . XI ROBOTIC ASSISTED SIMPLE PROSTATECTOMY     Dr. Tresa Moore 11/30/16    Social History  reports that he has never smoked. He has never used smokeless tobacco. He reports that he does not drink alcohol and does not use drugs.  No Known Allergies  Family History  Problem Relation Age of Onset  . Cancer Father        throat  . Hypertension Brother     Prior to Admission medications   Medication Sig Start  Date End Date Taking? Authorizing Provider  atorvastatin (LIPITOR) 10 MG tablet Take 10 mg by mouth every evening.   Yes [provider]  cetirizine (ZYRTEC) 10 MG tablet Take 10 mg by mouth at bedtime.    Yes [provider]  chlorhexidine (PERIDEX) 0.12 % solution Use as directed 5 mLs in the mouth or throat 2 (two) times daily.  08/26/19  Yes [provider]  fenofibrate 160 MG tablet Take 160 mg by mouth every evening.    Yes [provider]  ondansetron (ZOFRAN) 8 MG tablet Take 8 mg by mouth every 8 (eight) hours as needed for nausea or vomiting.   Yes [provider]  Ascorbic Acid (VITAMIN C) 1000 MG tablet Take 1,000 mg by mouth daily. Patient not taking: Reported on 09/01/2019    [provider]  Multiple Vitamins-Minerals (ZINC PO) Take 1 tablet by mouth daily. Patient not taking: Reported on 09/01/2019    [provider]  pantoprazole (PROTONIX) 40 MG tablet Take 40 mg by mouth at bedtime. Patient not taking: Reported on 09/01/2019    [provider]  polyvinyl alcohol (LIQUIFILM TEARS) 1.4 % ophthalmic solution Place 1 drop into both eyes as needed for dry eyes. Patient not taking: Reported on 09/01/2019    [provider]  potassium chloride SA (KLOR-CON) 20 MEQ tablet Take 1 tablet (20 mEq total) by mouth daily for 5 days. 05/05/19 05/10/19  Kayleen Memos, DO    Physical Exam: Vitals:   09/01/19 1630 09/01/19 1645 09/01/19 1800 09/01/19 2025  BP: 127/89 132/82 122/81 120/72  Pulse: 68 69 66 66  Resp: 16  16 20   Temp:    98 F (36.7 C)  TempSrc:    Oral  SpO2: 100% 100% 100% 98%  Weight:      Height:        Constitutional: NAD, calm, comfortable Vitals:   09/01/19 1630 09/01/19 1645 09/01/19 1800 09/01/19 2025  BP: 127/89 132/82 122/81 120/72  Pulse: 68 69 66 66  Resp: 16  16 20   Temp:    98 F (36.7 C)  TempSrc:    Oral  SpO2: 100% 100% 100% 98%  Weight:      Height:       Eyes: PERRL, lids and conjunctivae normal ENMT: Mucous membranes are moist. Posterior pharynx clear of any exudate or lesions.Normal dentition.  Neck: normal, supple, no masses, no thyromegaly Respiratory: clear to auscultation bilaterally, no wheezing, no crackles. Normal respiratory effort. No accessory muscle use.  Cardiovascular: Regular rate and rhythm, no murmurs / rubs / gallops. No extremity edema. 2+ pedal pulses. No carotid bruits.  Abdomen: TTP over  abdomen, greater in umbilical and lower quadrants. No rebound or guarding.  No hepatosplenomegaly. diminished to no Bowel sounds  Musculoskeletal: no clubbing / cyanosis. No joint deformity upper and lower extremities. Good ROM, no contractures. Normal muscle tone.  Skin: no rashes, lesions, ulcers. No induration Neurologic: CN 2-12 grossly intact. Sensation intact, DTR normal. Strength 5/5 in all 4.  Psychiatric: Normal judgment and insight. Alert and oriented x 3. Normal mood.    Labs on Admission: I have personally reviewed following labs and imaging studies  CBC: Recent Labs  Lab 09/01/19 1512 09/01/19 1951  WBC 9.3 8.5  HGB 15.2 14.3  HCT 43.7 41.9  MCV 91.6 92.5  PLT 247 102    Basic Metabolic Panel: Recent Labs  Lab 09/01/19 1512 09/01/19 1951  NA 138  --   K  4.3  --   CL 105  --   CO2 26  --   GLUCOSE 98  --   BUN 13  --   CREATININE 1.21 1.28*  CALCIUM 8.9  --     GFR: Estimated Creatinine Clearance: 62.6 mL/min (A) (by C-G formula based on SCr of 1.28 mg/dL (H)).  Liver Function Tests: Recent Labs  Lab 09/01/19 1512  AST 29  ALT 29  ALKPHOS 64  BILITOT 0.9  PROT 6.9  ALBUMIN 4.2    Urine analysis:    Component Value Date/Time   COLORURINE YELLOW 09/01/2019 1512   APPEARANCEUR CLEAR 09/01/2019 1512   LABSPEC 1.021 09/01/2019 1512   PHURINE 6.0 09/01/2019 1512   GLUCOSEU NEGATIVE 09/01/2019 1512   HGBUR NEGATIVE 09/01/2019 1512   BILIRUBINUR NEGATIVE 09/01/2019 1512   KETONESUR NEGATIVE 09/01/2019 1512   PROTEINUR NEGATIVE 09/01/2019 1512   UROBILINOGEN 0.2 10/09/2012 0440   NITRITE NEGATIVE 09/01/2019 1512   LEUKOCYTESUR NEGATIVE 09/01/2019 1512    Radiological Exams on Admission: CT Abdomen Pelvis W Contrast  Result Date: 09/01/2019 CLINICAL DATA:  59 year old male with history of bowel obstruction presenting with abdominal pain. EXAM: CT ABDOMEN AND PELVIS WITH CONTRAST TECHNIQUE: Multidetector CT imaging of the abdomen and pelvis was  performed using the standard protocol following bolus administration of intravenous contrast. CONTRAST:  150mL OMNIPAQUE IOHEXOL 300 MG/ML  SOLN COMPARISON:  CT abdomen pelvis dated 05/03/2019. FINDINGS: Lower chest: There are bibasilar linear atelectasis/scarring. The visualized lung bases are otherwise clear. No intra-abdominal free air. Small free fluid in the lower abdomen. Hepatobiliary: There is background of fatty liver. No intrahepatic biliary ductal dilatation. Cholecystectomy. No retained calcified stone noted in the central CBD. Pancreas: Unremarkable. No pancreatic ductal dilatation or surrounding inflammatory changes. Spleen: Normal in size without focal abnormality. Adrenals/Urinary Tract: The adrenal glands unremarkable. Mild fullness of the renal collecting system and pelvis similar to prior CT. There is bilateral extrarenal pelvis with mild pelviectasis. There is symmetric enhancement and excretion of contrast by both kidneys. The visualized ureters and urinary bladder appear unremarkable. Stomach/Bowel: There is thickening of the distal and terminal ileum. There is mild dilatation of the small bowel loops proximal to the terminal ileum measuring up to 3.1 cm in caliber. A transition is noted in the right lower abdomen (60/2 and 66/4). Findings consistent with a degree of small-bowel obstruction, possibly secondary to inflammatory changes of the distal/terminal ileum with associated wall thickening and luminal narrowing or secondary to adhesions. This appearance is similar to the prior CT. The colon is collapsed. The appendix is normal. Vascular/Lymphatic: The abdominal aorta and IVC unremarkable. No portal venous gas. There is no adenopathy. Reproductive: Prostatectomy. Other: Small fat containing umbilical hernia. Musculoskeletal: No acute osseous pathology. There is heterotopic calcification adjacent to the left ischial tuberosity. IMPRESSION: 1. Small-bowel obstruction with transition in the  right lower abdomen similar to prior CT. 2. Fatty liver. Electronically Signed   By: Anner Crete M.D.   On: 09/01/2019 17:42   DG Abd Portable 1V  Result Date: 09/01/2019 CLINICAL DATA:  Status post nasogastric tube placement. EXAM: PORTABLE ABDOMEN - 1 VIEW COMPARISON:  05/03/2019 FINDINGS: Nasogastric tube tip overlies the level of the mid stomach. Contrast is identified within the renal collecting systems following CT exam earlier today. Dilated bowel loops are identified within the abdomen. No evidence for free intraperitoneal air. IMPRESSION: Nasogastric tube tip overlying the level of the mid stomach. Electronically Signed   By: Nolon Nations M.D.  On: 09/01/2019 19:33     Assessment/Plan Active Problems:   SBO (small bowel obstruction) (HCC)   Prostate cancer (HCC)   Hyperlipidemia   1) SBO -general surgery consulted and will see in AM.  -recurrent SBO in same area as previous one in march.  -NG tube placed and patient to remain NPO -continue with IVF overnight and electrolyte monitoring. Will recheck again now.  -iv pain meds and anti-emetics as needed -SCDs in case of surgery   2) hyperlipidemia -NPO, resume home medication of lipitor and fenofibrate when tolerating PO diet.   3) prostate cancer  S/p prostatectomy.  -unsure if he had radiation, could be contributing to #1?    DVT prophylaxis: SCDs for now in case of surgery tomorrow.   Code Status:   Full  Family Communication:  Wife, Johnny Burns, at beside.  Disposition Plan:   Patient is from:  home  Anticipated DC to:  home  Anticipated DC date:  2-3 days   Anticipated DC barriers: Possible surgery   Consults called:  General surgery, Dr Marlou Starks,  consulted by ER physician.  Admission status:  Inpatient     Orma Flaming MD [facility]  How to contact the [facility] Attending or Consulting provider [place] or covering provider during after hours [place], for this patient?   1. Check the care team in  [facility] and look for a) attending/consulting [facility] provider listed and b) the [facility] team listed 2. Log into [email or web address] and use [place]'s universal password to access. If you do not have the password, please contact the hospital operator. 3. Locate the [facility] provider you are looking for under [facility] and page to a number that you can be directly reached. 4. If you still have difficulty reaching the provider, please page the [facility] (Director on Call) for the Hospitalists listed on amion for assistance.  09/01/2019, 8:30 PM

## 2019-09-01 NOTE — ED Triage Notes (Signed)
Abd pain in upper mid abd. States it feels like the last time he had a bowel obstruction, Denies N/V/D

## 2019-09-01 NOTE — ED Notes (Signed)
ED TO INPATIENT HANDOFF REPORT  ED Nurse Name and Phone #: jon rn wled    S Name/Age/Gender Johnny Burns 59 y.o. male Room/Bed: WA20/WA20  Code Status   Code Status: Full Code  Home/SNF/Other {Patient oriented alert x 4  Is this baseline yes   Triage Complete: Triage complete  Chief Complaint Small bowel obstruction ([place]) [O27.741]  Triage Note Abd pain in upper mid abd. States it feels like the last time he had a bowel obstruction, Denies N/V/D    Allergies No Known Allergies  Level of Care/Admitting Diagnosis ED Disposition    ED Disposition Condition [place]: [place] [100102]  Level of Care: Med-Surg [16]  May admit patient to [facility] or [facility] if equivalent level of care is available:: Yes  Covid Evaluation: Asymptomatic Screening Protocol (No Symptoms)  Diagnosis: Small bowel obstruction [facility]) [[id number]]  Admitting Physician: Orma Flaming [[id number]]  Attending Physician: Orma Flaming [[id number]]  Estimated length of stay: 3 - 4 days  Certification:: I certify this patient will need inpatient services for at least 2 midnights       B Medical/Surgery History Past Medical History:  Diagnosis Date  . Chronic kidney disease    Stage 3 sees primary physcian  . GERD (gastroesophageal reflux disease)   . History of kidney stones   . Hyperlipidemia   . Left ureteral calculus   . Prostate cancer [facility])    Past Surgical History:  Procedure Laterality Date  . CHOLECYSTECTOMY N/A 04/04/2019   Procedure: LAPAROSCOPIC CHOLECYSTECTOMY;  Surgeon: Kinsinger, Arta Bruce, MD;  Location: [facility];  Service: General;  Laterality: N/A;  . CYSTOSCOPY WITH RETROGRADE PYELOGRAM, URETEROSCOPY AND STENT PLACEMENT Left 11/30/2012   Procedure: CYSTOSCOPY WITH LEFT RETROGRADE PYELOGRAM, LEFT URETEROSCOPY LEFT STONE EXTRACTION WITH BASKET  AND LEFT STENT PLACEMENT;  Surgeon: Alexis Frock, MD;  Location:  [facility];  Service: Urology;  Laterality: Left;  . EXTRACORPOREAL SHOCK WAVE LITHOTRIPSY Left 10-08-2012  . INGUINAL HERNIA REPAIR Right 1990  . LYMPHADENECTOMY Bilateral 11/30/2016   Procedure: PELVIC LYMPHADENECTOMY;  Surgeon: Alexis Frock, MD;  Location[facility];  Service: Urology;  Laterality: Bilateral;  . ROBOT ASSISTED LAPAROSCOPIC RADICAL PROSTATECTOMY N/A 11/30/2016   Procedure: XI ROBOTIC ASSISTED LAPAROSCOPIC RADICAL PROSTATECTOMY;  Surgeon: Alexis Frock, MD;  [facility];  Service: Urology;  Laterality: N/A;  . WISDOM TOOTH EXTRACTION    . XI ROBOTIC ASSISTED SIMPLE PROSTATECTOMY     Dr. Tresa Moore 11/30/16     A IV Location/Drains/Wounds Patient Lines/Drains/Airways Status    Active Line/Drains/Airways    Name Placement date Placement time Site Days   Peripheral IV 09/01/19 Left Antecubital 09/01/19  1559  Antecubital  less than 1   Incision (Closed) 04/04/19 Abdomen 04/04/19  1024   150   Incision - 4 Ports Abdomen 1: Left;Upper 2: Left;Medial 3: Left;Lower Right;Medial 04/04/19  1025   150          Intake/Output Last 24 hours  Intake/Output Summary (Last 24 hours) at 09/01/2019 2004 Last data filed at 09/01/2019 1853 Gross per 24 hour  Intake 500 ml  Output --  Net 500 ml    Labs/Imaging Results for orders placed or performed during the hospital encounter of 09/01/19 (from the past 48 hour(s))  Lipase, blood     Status: None   Collection Time: 09/01/19  3:12 PM  Result Value Ref Range   Lipase 26 11 - 51 U/L  Comment: Performed at [facility], [place] [address], [place], [place] [address]  Comprehensive metabolic panel     Status: None   Collection Time: 09/01/19  3:12 PM  Result Value Ref Range   Sodium 138 135 - 145 mmol/L   Potassium 4.3 3.5 - 5.1 mmol/L   Chloride 105 98 - 111 mmol/L   CO2 26 22 - 32 mmol/L   Glucose, Bld 98 70 - 99 mg/dL    Comment: Glucose reference range applies only to samples taken after  fasting for at least 8 hours.   BUN 13 8 - 23 mg/dL   Creatinine, Ser 1.21 0.61 - 1.24 mg/dL   Calcium 8.9 8.9 - 10.3 mg/dL   Total Protein 6.9 6.5 - 8.1 g/dL   Albumin 4.2 3.5 - 5.0 g/dL   AST 29 15 - 41 U/L   ALT 29 0 - 44 U/L   Alkaline Phosphatase 64 38 - 126 U/L   Total Bilirubin 0.9 0.3 - 1.2 mg/dL   GFR calc non Af Amer >60 >60 mL/min   GFR calc Af Amer >60 >60 mL/min   Anion gap 7 5 - 15    Comment: Performed at [facility], [place] [address] [place], [place] [address]  CBC     Status: None   Collection Time: 09/01/19  3:12 PM  Result Value Ref Range   WBC 9.3 4.0 - 10.5 K/uL   RBC 4.77 4.22 - 5.81 MIL/uL   Hemoglobin 15.2 13.0 - 17.0 g/dL   HCT 43.7 39 - 52 %   MCV 91.6 80.0 - 100.0 fL   MCH 31.9 26.0 - 34.0 pg   MCHC 34.8 30.0 - 36.0 g/dL   RDW 12.8 11.5 - 15.5 %   Platelets 247 150 - 400 K/uL   nRBC 0.0 0.0 - 0.2 %    Comment: Performed at [facility], [place] [address], [place], [place] [address]  Urinalysis, Routine w reflex microscopic     Status: None   Collection Time: 09/01/19  3:12 PM  Result Value Ref Range   Color, Urine YELLOW YELLOW   APPearance CLEAR CLEAR   Specific Gravity, Urine 1.021 1.005 - 1.030   pH 6.0 5.0 - 8.0   Glucose, UA NEGATIVE NEGATIVE mg/dL   Hgb urine dipstick NEGATIVE NEGATIVE   Bilirubin Urine NEGATIVE NEGATIVE   Ketones, ur NEGATIVE NEGATIVE mg/dL   Protein, ur NEGATIVE NEGATIVE mg/dL   Nitrite NEGATIVE NEGATIVE   Leukocytes,Ua NEGATIVE NEGATIVE    Comment: Performed at [place] [address], [place],  [address]   CT Abdomen Pelvis W Contrast  Result Date: 09/01/2019 CLINICAL DATA:  59 year old male with history of bowel obstruction presenting with abdominal pain. EXAM: CT ABDOMEN AND PELVIS WITH CONTRAST TECHNIQUE: Multidetector CT imaging of the abdomen and pelvis was performed using the standard protocol following bolus administration of intravenous contrast.  CONTRAST:  137mL OMNIPAQUE IOHEXOL 300 MG/ML  SOLN COMPARISON:  CT abdomen pelvis dated 05/03/2019. FINDINGS: Lower chest: There are bibasilar linear atelectasis/scarring. The visualized lung bases are otherwise clear. No intra-abdominal free air. Small free fluid in the lower abdomen. Hepatobiliary: There is background of fatty liver. No intrahepatic biliary ductal dilatation. Cholecystectomy. No retained calcified stone noted in the central CBD. Pancreas: Unremarkable. No pancreatic ductal dilatation or surrounding inflammatory changes. Spleen: Normal in size without focal abnormality. Adrenals/Urinary Tract: The adrenal glands unremarkable. Mild fullness of the renal collecting system and pelvis similar  to prior CT. There is bilateral extrarenal pelvis with mild pelviectasis. There is symmetric enhancement and excretion of contrast by both kidneys. The visualized ureters and urinary bladder appear unremarkable. Stomach/Bowel: There is thickening of the distal and terminal ileum. There is mild dilatation of the small bowel loops proximal to the terminal ileum measuring up to 3.1 cm in caliber. A transition is noted in the right lower abdomen (60/2 and 66/4). Findings consistent with a degree of small-bowel obstruction, possibly secondary to inflammatory changes of the distal/terminal ileum with associated wall thickening and luminal narrowing or secondary to adhesions. This appearance is similar to the prior CT. The colon is collapsed. The appendix is normal. Vascular/Lymphatic: The abdominal aorta and IVC unremarkable. No portal venous gas. There is no adenopathy. Reproductive: Prostatectomy. Other: Small fat containing umbilical hernia. Musculoskeletal: No acute osseous pathology. There is heterotopic calcification adjacent to the left ischial tuberosity. IMPRESSION: 1. Small-bowel obstruction with transition in the right lower abdomen similar to prior CT. 2. Fatty liver. Electronically Signed   By: Anner Crete M.D.   On: 09/01/2019 17:42   DG Abd Portable 1V  Result Date: 09/01/2019 CLINICAL DATA:  Status post nasogastric tube placement. EXAM: PORTABLE ABDOMEN - 1 VIEW COMPARISON:  05/03/2019 FINDINGS: Nasogastric tube tip overlies the level of the mid stomach. Contrast is identified within the renal collecting systems following CT exam earlier today. Dilated bowel loops are identified within the abdomen. No evidence for free intraperitoneal air. IMPRESSION: Nasogastric tube tip overlying the level of the mid stomach. Electronically Signed   By: Nolon Nations M.D.   On: 09/01/2019 19:33    Pending Labs Unresulted Labs (From admission, onward) Comment          Start     Ordered   09/08/19 0500  Creatinine, serum  (enoxaparin (LOVENOX)    CrCl >/= 30 ml/min)  Weekly,   R     Comments: while on enoxaparin therapy    09/01/19 1933   09/02/19 7062  Basic metabolic panel  Tomorrow morning,   R        09/01/19 1933   09/02/19 0500  CBC  Tomorrow morning,   R        09/01/19 1933   09/01/19 1927  CBC  (enoxaparin (LOVENOX)    CrCl >/= 30 ml/min)  Once,   STAT       Comments: Baseline for enoxaparin therapy IF NOT ALREADY DRAWN.  Notify MD if PLT < 100 K.    09/01/19 1933   09/01/19 1927  Creatinine, serum  (enoxaparin (LOVENOX)    CrCl >/= 30 ml/min)  Once,   STAT       Comments: Baseline for enoxaparin therapy IF NOT ALREADY DRAWN.    09/01/19 1933   09/01/19 1802  SARS Coronavirus 2 by RT PCR (hospital order, performed in [place] hospital lab) Nasopharyngeal Nasopharyngeal Swab  (Tier 2 (TAT 2 hrs))  Once,   STAT       Question Answer Comment  Is this test for diagnosis or screening Screening   Symptomatic for COVID-19 as defined by CDC No   Hospitalized for COVID-19 No   Admitted to ICU for COVID-19 No   Previously tested for COVID-19 Yes   Resident in a congregate (group) care setting No   Employed in healthcare setting No   Has patient completed COVID vaccination(s) (2  doses of Pfizer/Moderna 1 dose of [employer]) Unknown      09/01/19 1801  Vitals/Pain Today's Vitals   09/01/19 1630 09/01/19 1645 09/01/19 1800 09/01/19 1856  BP: 127/89 132/82 122/81   Pulse: 68 69 66   Resp: 16  16   Temp:      TempSrc:      SpO2: 100% 100% 100%   Weight:      Height:      PainSc:    5     Isolation Precautions No active isolations  Medications Medications  sodium chloride (PF) 0.9 % injection (has no administration in time range)  enoxaparin (LOVENOX) injection 40 mg (has no administration in time range)  0.9 %  sodium chloride infusion (has no administration in time range)  morphine 2 MG/ML injection 2 mg (has no administration in time range)  ondansetron (ZOFRAN) tablet 4 mg (has no administration in time range)    Or  ondansetron (ZOFRAN) injection 4 mg (has no administration in time range)  sodium chloride flush (NS) 0.9 % injection 3 mL (3 mLs Intravenous Given 09/01/19 1559)  sodium chloride 0.9 % bolus 500 mL (0 mLs Intravenous Stopped 09/01/19 1853)  morphine 4 MG/ML injection 4 mg (4 mg Intravenous Given 09/01/19 1647)  ondansetron (ZOFRAN) injection 4 mg (4 mg Intravenous Given 09/01/19 1645)  iohexol (OMNIPAQUE) 300 MG/ML solution 100 mL (100 mLs Intravenous Contrast Given 09/01/19 1716)  lidocaine (XYLOCAINE) 2 % jelly 1 application (1 application Other Given 09/01/19 1856)  morphine 4 MG/ML injection 4 mg (4 mg Intravenous Given 09/01/19 1948)  ondansetron (ZOFRAN) injection 4 mg (4 mg Intravenous Given 09/01/19 1947)    Mobility walks Low fall risk   Focused Assessments gastro   R Recommendations: See Admitting Provider Note  Report given to:   Additional Notes:

## 2019-09-01 NOTE — ED Provider Notes (Signed)
[facility] DEPT Provider Note   CSN: [id number] Arrival date & time: 09/01/19  1453     History Chief Complaint  Patient presents with  . Abdominal Pain    Johnny Burns is a 59 y.o. male.  Patient is a 59 year old male who presents with abdominal pain.  He has a history of a prior recent small bowel obstruction in March of this year.  He has had prior cholecystectomy and prostatectomy and a small bowel obstruction was felt to be related to adhesions.  He reports similar pain today.  He said it started during the night.  Its sharp crampy pain in the middle of his abdomen radiating somewhat through to his back.  No associated nausea or vomiting.  He had a small bowel movement this morning but otherwise none.  He says he is passing a little bit of gas.  No fevers.  No urinary symptoms.        Past Medical History:  Diagnosis Date  . Chronic kidney disease    Stage 3 sees primary physcian  . GERD (gastroesophageal reflux disease)   . History of kidney stones   . Hyperlipidemia   . Left ureteral calculus   . Prostate cancer [facility])     Patient Active Problem List   Diagnosis Date Noted  . SOB (shortness of breath) 05/03/2019  . SBO (small bowel obstruction) ([place]) 05/03/2019  . Yeast dermatitis 07/20/2017  . Prostate cancer ([place]) 11/30/2016    Past Surgical History:  Procedure Laterality Date  . CHOLECYSTECTOMY N/A 04/04/2019   Procedure: LAPAROSCOPIC CHOLECYSTECTOMY;  Surgeon: Kinsinger, Arta Bruce, MD;  Location: [facility];  Service: General;  Laterality: N/A;  . CYSTOSCOPY WITH RETROGRADE PYELOGRAM, URETEROSCOPY AND STENT PLACEMENT Left 11/30/2012   Procedure: CYSTOSCOPY WITH LEFT RETROGRADE PYELOGRAM, LEFT URETEROSCOPY LEFT STONE EXTRACTION WITH BASKET  AND LEFT STENT PLACEMENT;  Surgeon: Alexis Frock, MD;  Location: [facility];  Service: Urology;  Laterality: Left;  . EXTRACORPOREAL SHOCK WAVE LITHOTRIPSY  Left 10-08-2012  . INGUINAL HERNIA REPAIR Right 1990  . LYMPHADENECTOMY Bilateral 11/30/2016   Procedure: PELVIC LYMPHADENECTOMY;  Surgeon: Alexis Frock, MD;  Location: [facility];  Service: Urology;  Laterality: Bilateral;  . ROBOT ASSISTED LAPAROSCOPIC RADICAL PROSTATECTOMY N/A 11/30/2016   Procedure: XI ROBOTIC ASSISTED LAPAROSCOPIC RADICAL PROSTATECTOMY;  Surgeon: Alexis Frock, MD;  Location: [facility];  Service: Urology;  Laterality: N/A;  . WISDOM TOOTH EXTRACTION    . XI ROBOTIC ASSISTED SIMPLE PROSTATECTOMY     Dr. Tresa Moore 11/30/16       Family History  Problem Relation Age of Onset  . Cancer Father        throat  . Hypertension Brother     Social History   Tobacco Use  . Smoking status: Never Smoker  . Smokeless tobacco: Never Used  Vaping Use  . Vaping Use: Never used  Substance Use Topics  . Alcohol use: No  . Drug use: No    Home Medications Prior to Admission medications   Medication Sig Start Date End Date Taking? Authorizing Provider  atorvastatin (LIPITOR) 10 MG tablet Take 10 mg by mouth every evening.   Yes [provider]  cetirizine (ZYRTEC) 10 MG tablet Take 10 mg by mouth at bedtime.    Yes [provider]  chlorhexidine (PERIDEX) 0.12 % solution Use as directed 5 mLs in the mouth or throat 2 (two) times daily.  08/26/19  Yes [provider]  fenofibrate 160 MG tablet  Take 160 mg by mouth every evening.   Yes [provider]  ondansetron (ZOFRAN) 8 MG tablet Take 8 mg by mouth every 8 (eight) hours as needed for nausea or vomiting.   Yes [provider]  Ascorbic Acid (VITAMIN C) 1000 MG tablet Take 1,000 mg by mouth daily. Patient not taking: Reported on 09/01/2019    [provider]  Multiple Vitamins-Minerals (ZINC PO) Take 1 tablet by mouth daily. Patient not taking: Reported on 09/01/2019    [provider]  pantoprazole (PROTONIX) 40 MG tablet Take 40 mg by mouth at bedtime. Patient not  taking: Reported on 09/01/2019    [provider]  polyvinyl alcohol (LIQUIFILM TEARS) 1.4 % ophthalmic solution Place 1 drop into both eyes as needed for dry eyes. Patient not taking: Reported on 09/01/2019    [provider]  potassium chloride SA (KLOR-CON) 20 MEQ tablet Take 1 tablet (20 mEq total) by mouth daily for 5 days. 05/05/19 05/10/19  Kayleen Memos, DO    Allergies    Patient has no known allergies.  Review of Systems   Review of Systems  Constitutional: Negative for chills, diaphoresis, fatigue and fever.  HENT: Negative for congestion, rhinorrhea and sneezing.   Eyes: Negative.   Respiratory: Negative for cough, chest tightness and shortness of breath.   Cardiovascular: Negative for chest pain and leg swelling.  Gastrointestinal: Positive for abdominal pain. Negative for blood in stool, diarrhea, nausea and vomiting.  Genitourinary: Negative for difficulty urinating, flank pain, frequency and hematuria.  Musculoskeletal: Negative for arthralgias and back pain.  Skin: Negative for rash.  Neurological: Negative for dizziness, speech difficulty, weakness, numbness and headaches.    Physical Exam Updated Vital Signs BP 122/81   Pulse 66   Temp 98.1 F (36.7 C) (Oral)   Resp 16   Ht 5\' 10"  (1.778 m)   Wt 83.9 kg   SpO2 100%   BMI 26.54 kg/m   Physical Exam Constitutional:      Appearance: He is well-developed.  HENT:     Head: Normocephalic and atraumatic.  Eyes:     Pupils: Pupils are equal, round, and reactive to light.  Cardiovascular:     Rate and Rhythm: Normal rate and regular rhythm.     Heart sounds: Normal heart sounds.  Pulmonary:     Effort: Pulmonary effort is normal. No respiratory distress.     Breath sounds: Normal breath sounds. No wheezing or rales.  Chest:     Chest wall: No tenderness.  Abdominal:     General: Bowel sounds are normal.     Palpations: Abdomen is soft.     Tenderness: There is abdominal tenderness in the  periumbilical area and left lower quadrant. There is no guarding or rebound.  Musculoskeletal:        General: Normal range of motion.     Cervical back: Normal range of motion and neck supple.  Lymphadenopathy:     Cervical: No cervical adenopathy.  Skin:    General: Skin is warm and dry.     Findings: No rash.  Neurological:     Mental Status: He is alert and oriented to person, place, and time.     ED Results / Procedures / Treatments   Labs (all labs ordered are listed, but only abnormal results are displayed) Labs Reviewed  SARS CORONAVIRUS 2 BY RT PCR ([facility])  LIPASE, BLOOD  COMPREHENSIVE METABOLIC PANEL  CBC  URINALYSIS, ROUTINE W REFLEX MICROSCOPIC    EKG None  Radiology CT Abdomen Pelvis W Contrast  Result Date: 09/01/2019 CLINICAL DATA:  59 year old male with history of bowel obstruction presenting with abdominal pain. EXAM: CT ABDOMEN AND PELVIS WITH CONTRAST TECHNIQUE: Multidetector CT imaging of the abdomen and pelvis was performed using the standard protocol following bolus administration of intravenous contrast. CONTRAST:  165mL OMNIPAQUE IOHEXOL 300 MG/ML  SOLN COMPARISON:  CT abdomen pelvis dated 05/03/2019. FINDINGS: Lower chest: There are bibasilar linear atelectasis/scarring. The visualized lung bases are otherwise clear. No intra-abdominal free air. Small free fluid in the lower abdomen. Hepatobiliary: There is background of fatty liver. No intrahepatic biliary ductal dilatation. Cholecystectomy. No retained calcified stone noted in the central CBD. Pancreas: Unremarkable. No pancreatic ductal dilatation or surrounding inflammatory changes. Spleen: Normal in size without focal abnormality. Adrenals/Urinary Tract: The adrenal glands unremarkable. Mild fullness of the renal collecting system and pelvis similar to prior CT. There is bilateral extrarenal pelvis with mild pelviectasis. There is symmetric enhancement and  excretion of contrast by both kidneys. The visualized ureters and urinary bladder appear unremarkable. Stomach/Bowel: There is thickening of the distal and terminal ileum. There is mild dilatation of the small bowel loops proximal to the terminal ileum measuring up to 3.1 cm in caliber. A transition is noted in the right lower abdomen (60/2 and 66/4). Findings consistent with a degree of small-bowel obstruction, possibly secondary to inflammatory changes of the distal/terminal ileum with associated wall thickening and luminal narrowing or secondary to adhesions. This appearance is similar to the prior CT. The colon is collapsed. The appendix is normal. Vascular/Lymphatic: The abdominal aorta and IVC unremarkable. No portal venous gas. There is no adenopathy. Reproductive: Prostatectomy. Other: Small fat containing umbilical hernia. Musculoskeletal: No acute osseous pathology. There is heterotopic calcification adjacent to the left ischial tuberosity. IMPRESSION: 1. Small-bowel obstruction with transition in the right lower abdomen similar to prior CT. 2. Fatty liver. Electronically Signed   By: Anner Crete M.D.   On: 09/01/2019 17:42    Procedures Procedures (including critical care time)  Medications Ordered in ED Medications  sodium chloride (PF) 0.9 % injection (has no administration in time range)  morphine 4 MG/ML injection 4 mg (has no administration in time range)  ondansetron (ZOFRAN) injection 4 mg (has no administration in time range)  sodium chloride flush (NS) 0.9 % injection 3 mL (3 mLs Intravenous Given 09/01/19 1559)  sodium chloride 0.9 % bolus 500 mL (0 mLs Intravenous Stopped 09/01/19 1853)  morphine 4 MG/ML injection 4 mg (4 mg Intravenous Given 09/01/19 1647)  ondansetron (ZOFRAN) injection 4 mg (4 mg Intravenous Given 09/01/19 1645)  iohexol (OMNIPAQUE) 300 MG/ML solution 100 mL (100 mLs Intravenous Contrast Given 09/01/19 1716)  lidocaine (XYLOCAINE) 2 % jelly 1 application (1  application Other Given 09/01/19 1856)    ED Course  I have reviewed the triage vital signs and the nursing notes.  Pertinent labs & imaging results that were available during my care of the patient were reviewed by me and considered in my medical decision making (see chart for details).    MDM Rules/Calculators/A&P                          Patient is a 59 year old male who presents with abdominal pain.  He had a recent small bowel obstruction in March.  CT scan shows a recurrence of his small bowel obstruction with a similar transition point as  previously seen.  NG tube was placed.  His labs are nonconcerning.  I spoke to the surgeon on-call, Dr. Marlou Starks who recommends hospitalist to admit the patient and surgery will see the patient in the morning.  I spoke with Dr. Rogers Blocker with the hospitalist service to admit the patient for further treatment.  Final Clinical Impression(s) / ED Diagnoses Final diagnoses:  SBO (small bowel obstruction) ([place])    Rx / DC Orders ED Discharge Orders    None       Malvin Johns, MD 09/01/19 1900

## 2019-09-02 ENCOUNTER — Inpatient Hospital Stay (HOSPITAL_COMMUNITY): Payer: 59

## 2019-09-02 DIAGNOSIS — K56609 Unspecified intestinal obstruction, unspecified as to partial versus complete obstruction: Principal | ICD-10-CM

## 2019-09-02 DIAGNOSIS — E785 Hyperlipidemia, unspecified: Secondary | ICD-10-CM

## 2019-09-02 DIAGNOSIS — C61 Malignant neoplasm of prostate: Secondary | ICD-10-CM

## 2019-09-02 DIAGNOSIS — K219 Gastro-esophageal reflux disease without esophagitis: Secondary | ICD-10-CM

## 2019-09-02 LAB — CBC
HCT: 40.6 % (ref 39.0–52.0)
Hemoglobin: 13.8 g/dL (ref 13.0–17.0)
MCH: 31.4 pg (ref 26.0–34.0)
MCHC: 34 g/dL (ref 30.0–36.0)
MCV: 92.3 fL (ref 80.0–100.0)
Platelets: 230 10*3/uL (ref 150–400)
RBC: 4.4 MIL/uL (ref 4.22–5.81)
RDW: 13 % (ref 11.5–15.5)
WBC: 8.6 10*3/uL (ref 4.0–10.5)
nRBC: 0 % (ref 0.0–0.2)

## 2019-09-02 LAB — BASIC METABOLIC PANEL
Anion gap: 8 (ref 5–15)
BUN: 12 mg/dL (ref 8–23)
CO2: 22 mmol/L (ref 22–32)
Calcium: 8.6 mg/dL — ABNORMAL LOW (ref 8.9–10.3)
Chloride: 108 mmol/L (ref 98–111)
Creatinine, Ser: 1.16 mg/dL (ref 0.61–1.24)
GFR calc Af Amer: >60 mL/min (ref >60)
GFR calc non Af Amer: >60 mL/min (ref >60)
Glucose, Bld: 83 mg/dL (ref 70–99)
Potassium: 4.3 mmol/L (ref 3.5–5.1)
Sodium: 138 mmol/L (ref 135–145)

## 2019-09-02 MED ORDER — HEPARIN SODIUM (PORCINE) 5000 UNIT/ML IJ SOLN
5000.0000 [IU] | Freq: Three times a day (TID) | INTRAMUSCULAR | Status: DC
  Administered 2019-09-02 – 2019-09-03 (×4): 5000 [IU] via SUBCUTANEOUS
  Filled 2019-09-02 (×4): qty 1

## 2019-09-02 MED ORDER — DIATRIZOATE MEGLUMINE & SODIUM 66-10 % PO SOLN
90.0000 mL | Freq: Once | ORAL | Status: AC
  Administered 2019-09-02: 90 mL via NASOGASTRIC
  Filled 2019-09-02: qty 90

## 2019-09-02 MED ORDER — KETOROLAC TROMETHAMINE 15 MG/ML IJ SOLN
15.0000 mg | Freq: Once | INTRAMUSCULAR | Status: AC
  Administered 2019-09-02: 15 mg via INTRAVENOUS
  Filled 2019-09-02: qty 1

## 2019-09-02 MED ORDER — PANTOPRAZOLE SODIUM 40 MG IV SOLR
40.0000 mg | INTRAVENOUS | Status: DC
  Administered 2019-09-02 – 2019-09-03 (×2): 40 mg via INTRAVENOUS
  Filled 2019-09-02 (×2): qty 40

## 2019-09-02 MED ORDER — FLUTICASONE PROPIONATE 50 MCG/ACT NA SUSP
2.0000 | Freq: Every day | NASAL | Status: DC
  Administered 2019-09-02: 2 via NASAL
  Filled 2019-09-02: qty 16

## 2019-09-02 NOTE — Consult Note (Signed)
Reason for Consult: Abdominal pain Referring Physician: Dr. Shella Spearing Johnny Burns is an 59 y.o. male.  HPI: The patient is a 59 year old white male who presents with a 1 day history of right-sided abdominal discomfort with nausea and vomiting.  He denies any fevers or chills.  He has not passed any flatus for a day or 2.  He has a history of previous bowel obstruction in March which resolved without surgery.  He feels very similar to the way he did back then.  He came to the emergency department where a CT scan was suggestive of a small bowel obstruction.  He was admitted to the medicine service.  An NG tube was placed and he has been on bowel rest.  Past Medical History:  Diagnosis Date  . Chronic kidney disease    Stage 3 sees primary physcian  . GERD (gastroesophageal reflux disease)   . History of kidney stones   . Hyperlipidemia   . Left ureteral calculus   . Prostate cancer [facility])     Past Surgical History:  Procedure Laterality Date  . CHOLECYSTECTOMY N/A 04/04/2019   Procedure: LAPAROSCOPIC CHOLECYSTECTOMY;  Surgeon: Kinsinger, Arta Bruce, MD;  Location: [facility];  Service: General;  Laterality: N/A;  . CYSTOSCOPY WITH RETROGRADE PYELOGRAM, URETEROSCOPY AND STENT PLACEMENT Left 11/30/2012   Procedure: CYSTOSCOPY WITH LEFT RETROGRADE PYELOGRAM, LEFT URETEROSCOPY LEFT STONE EXTRACTION WITH BASKET  AND LEFT STENT PLACEMENT;  Surgeon: Alexis Frock, MD;  Location: [facility];  Service: Urology;  Laterality: Left;  . EXTRACORPOREAL SHOCK WAVE LITHOTRIPSY Left 10-08-2012  . INGUINAL HERNIA REPAIR Right 1990  . LYMPHADENECTOMY Bilateral 11/30/2016   Procedure: PELVIC LYMPHADENECTOMY;  Surgeon: Alexis Frock, MD;  Location: [facility];  Service: Urology;  Laterality: Bilateral;  . ROBOT ASSISTED LAPAROSCOPIC RADICAL PROSTATECTOMY N/A 11/30/2016   Procedure: XI ROBOTIC ASSISTED LAPAROSCOPIC RADICAL PROSTATECTOMY;  Surgeon: Alexis Frock, MD;  Location: [facility];  Service: Urology;  Laterality: N/A;  . WISDOM TOOTH EXTRACTION    . XI ROBOTIC ASSISTED SIMPLE PROSTATECTOMY     Dr. Tresa Moore 11/30/16    Family History  Problem Relation Age of Onset  . Cancer Father        throat  . Hypertension Brother     Social History:  reports that he has never smoked. He has never used smokeless tobacco. He reports that he does not drink alcohol and does not use drugs.  Allergies: No Known Allergies  Medications: I have reviewed the patient's current medications.  Results for orders placed or performed during the hospital encounter of 09/01/19 (from the past 48 hour(s))  Lipase, blood     Status: None   Collection Time: 09/01/19  3:12 PM  Result Value Ref Range   Lipase 26 11 - 51 U/L    Comment: Performed at [facility], [place] [address] [place], [place] [address]  Comprehensive metabolic panel     Status: None   Collection Time: 09/01/19  3:12 PM  Result Value Ref Range   Sodium 138 135 - 145 mmol/L   Potassium 4.3 3.5 - 5.1 mmol/L   Chloride 105 98 - 111 mmol/L   CO2 26 22 - 32 mmol/L   Glucose, Bld 98 70 - 99 mg/dL    Comment: Glucose reference range applies only to samples taken after fasting for at least 8 hours.   BUN 13 8 - 23 mg/dL   Creatinine, Ser 1.21 0.61 - 1.24 mg/dL   Calcium 8.9  8.9 - 10.3 mg/dL   Total Protein 6.9 6.5 - 8.1 g/dL   Albumin 4.2 3.5 - 5.0 g/dL   AST 29 15 - 41 U/L   ALT 29 0 - 44 U/L   Alkaline Phosphatase 64 38 - 126 U/L   Total Bilirubin 0.9 0.3 - 1.2 mg/dL   GFR calc non Af Amer >60 >60 mL/min   GFR calc Af Amer >60 >60 mL/min   Anion gap 7 5 - 15    Comment: Performed at [facility], [place] [address], [place], [place] [address]  CBC     Status: None   Collection Time: 09/01/19  3:12 PM  Result Value Ref Range   WBC 9.3 4.0 - 10.5 K/uL   RBC 4.77 4.22 - 5.81 MIL/uL   Hemoglobin 15.2 13.0 - 17.0 g/dL   HCT 43.7 39 - 52 %   MCV 91.6 80.0 - 100.0 fL   MCH 31.9 26.0 -  34.0 pg   MCHC 34.8 30.0 - 36.0 g/dL   RDW 12.8 11.5 - 15.5 %   Platelets 247 150 - 400 K/uL   nRBC 0.0 0.0 - 0.2 %    Comment: Performed at [facility], [place] [address], [place], [place] [address]  Urinalysis, Routine w reflex microscopic     Status: None   Collection Time: 09/01/19  3:12 PM  Result Value Ref Range   Color, Urine YELLOW YELLOW   APPearance CLEAR CLEAR   Specific Gravity, Urine 1.021 1.005 - 1.030   pH 6.0 5.0 - 8.0   Glucose, UA NEGATIVE NEGATIVE mg/dL   Hgb urine dipstick NEGATIVE NEGATIVE   Bilirubin Urine NEGATIVE NEGATIVE   Ketones, ur NEGATIVE NEGATIVE mg/dL   Protein, ur NEGATIVE NEGATIVE mg/dL   Nitrite NEGATIVE NEGATIVE   Leukocytes,Ua NEGATIVE NEGATIVE    Comment: Performed at [place] [address], [place],  [address]  SARS Coronavirus 2 by RT PCR (hospital order, performed in [facility] hospital lab) Nasopharyngeal Nasopharyngeal Swab     Status: None   Collection Time: 09/01/19  7:51 PM   Specimen: Nasopharyngeal Swab  Result Value Ref Range   SARS Coronavirus 2 NEGATIVE NEGATIVE    Comment: (NOTE) SARS-CoV-2 target nucleic acids are NOT DETECTED.  The SARS-CoV-2 RNA is generally detectable in upper and lower respiratory specimens during the acute phase of infection. The lowest concentration of SARS-CoV-2 viral copies this assay can detect is 250 copies / mL. A negative result does not preclude SARS-CoV-2 infection and should not be used as the sole basis for treatment or other patient management decisions.  A negative result may occur with improper specimen collection / handling, submission of specimen other than nasopharyngeal swab, presence of viral mutation(s) within the areas targeted by this assay, and inadequate number of viral copies (<250 copies / mL). A negative result must be combined with clinical observations, patient history, and epidemiological information.  Fact Sheet for  Patients:   StrictlyIdeas.no  Fact Sheet for Healthcare Providers: BankingDealers.co.za  This test is not yet approved or  cleared by the [place] FDA and has been authorized for detection and/or diagnosis of SARS-CoV-2 by FDA under an Emergency Use Authorization (EUA).  This EUA will remain in effect (meaning this test can be used) for the duration of the COVID-19 declaration under Section 564(b)(1) of the Act, 21 U.S.C. section 360bbb-3(b)(1), unless the authorization is terminated or revoked sooner.  Performed at [facility], [address] [place], [place] [address]   CBC     Status: None   Collection Time: 09/01/19  7:51 PM  Result Value Ref Range   WBC 8.5 4.0 - 10.5 K/uL   RBC 4.53 4.22 - 5.81 MIL/uL   Hemoglobin 14.3 13.0 - 17.0 g/dL   HCT 41.9 39 - 52 %   MCV 92.5 80.0 - 100.0 fL   MCH 31.6 26.0 - 34.0 pg   MCHC 34.1 30.0 - 36.0 g/dL   RDW 13.0 11.5 - 15.5 %   Platelets 223 150 - 400 K/uL   nRBC 0.0 0.0 - 0.2 %    Comment: Performed at [facility], [place] [address], [place], [place] [address]  Creatinine, serum     Status: Abnormal   Collection Time: 09/01/19  7:51 PM  Result Value Ref Range   Creatinine, Ser 1.28 (H) 0.61 - 1.24 mg/dL   GFR calc non Af Amer >60 >60 mL/min   GFR calc Af Amer >60 >60 mL/min    Comment: Performed at [facility], [place] [address], [place], [place] [address]  Basic metabolic panel     Status: Abnormal   Collection Time: 09/01/19  8:48 PM  Result Value Ref Range   Sodium 138 135 - 145 mmol/L   Potassium 4.0 3.5 - 5.1 mmol/L   Chloride 108 98 - 111 mmol/L   CO2 22 22 - 32 mmol/L   Glucose, Bld 87 70 - 99 mg/dL    Comment: Glucose reference range applies only to samples taken after fasting for at least 8 hours.   BUN 12 8 - 23 mg/dL   Creatinine, Ser 1.14 0.61 - 1.24 mg/dL   Calcium 8.6 (L) 8.9 - 10.3 mg/dL   GFR calc non Af  Amer >60 >60 mL/min   GFR calc Af Amer >60 >60 mL/min   Anion gap 8 5 - 15    Comment: Performed at [facility], [place] [address] [place], [place] [address]  Basic metabolic panel     Status: Abnormal   Collection Time: 09/02/19  6:23 AM  Result Value Ref Range   Sodium 138 135 - 145 mmol/L   Potassium 4.3 3.5 - 5.1 mmol/L   Chloride 108 98 - 111 mmol/L   CO2 22 22 - 32 mmol/L   Glucose, Bld 83 70 - 99 mg/dL    Comment: Glucose reference range applies only to samples taken after fasting for at least 8 hours.   BUN 12 8 - 23 mg/dL   Creatinine, Ser 1.16 0.61 - 1.24 mg/dL   Calcium 8.6 (L) 8.9 - 10.3 mg/dL   GFR calc non Af Amer >60 >60 mL/min   GFR calc Af Amer >60 >60 mL/min   Anion gap 8 5 - 15    Comment: Performed at [facility], [place] [address] [place],  [address]  CBC     Status: None   Collection Time: 09/02/19  6:23 AM  Result Value Ref Range   WBC 8.6 4.0 - 10.5 K/uL   RBC 4.40 4.22 - 5.81 MIL/uL   Hemoglobin 13.8 13.0 - 17.0 g/dL   HCT 40.6 39 - 52 %   MCV 92.3 80.0 - 100.0 fL   MCH 31.4 26.0 - 34.0 pg   MCHC 34.0 30.0 - 36.0 g/dL   RDW 13.0 11.5 - 15.5 %   Platelets 230 150 - 400 K/uL   nRBC 0.0 0.0 - 0.2 %  Comment: Performed at [facility], [place] [address] [place], [place] [address]    CT Abdomen Pelvis W Contrast  Result Date: 09/01/2019 CLINICAL DATA:  59 year old male with history of bowel obstruction presenting with abdominal pain. EXAM: CT ABDOMEN AND PELVIS WITH CONTRAST TECHNIQUE: Multidetector CT imaging of the abdomen and pelvis was performed using the standard protocol following bolus administration of intravenous contrast. CONTRAST:  148mL OMNIPAQUE IOHEXOL 300 MG/ML  SOLN COMPARISON:  CT abdomen pelvis dated 05/03/2019. FINDINGS: Lower chest: There are bibasilar linear atelectasis/scarring. The visualized lung bases are otherwise clear. No intra-abdominal free air. Small free fluid in the  lower abdomen. Hepatobiliary: There is background of fatty liver. No intrahepatic biliary ductal dilatation. Cholecystectomy. No retained calcified stone noted in the central CBD. Pancreas: Unremarkable. No pancreatic ductal dilatation or surrounding inflammatory changes. Spleen: Normal in size without focal abnormality. Adrenals/Urinary Tract: The adrenal glands unremarkable. Mild fullness of the renal collecting system and pelvis similar to prior CT. There is bilateral extrarenal pelvis with mild pelviectasis. There is symmetric enhancement and excretion of contrast by both kidneys. The visualized ureters and urinary bladder appear unremarkable. Stomach/Bowel: There is thickening of the distal and terminal ileum. There is mild dilatation of the small bowel loops proximal to the terminal ileum measuring up to 3.1 cm in caliber. A transition is noted in the right lower abdomen (60/2 and 66/4). Findings consistent with a degree of small-bowel obstruction, possibly secondary to inflammatory changes of the distal/terminal ileum with associated wall thickening and luminal narrowing or secondary to adhesions. This appearance is similar to the prior CT. The colon is collapsed. The appendix is normal. Vascular/Lymphatic: The abdominal aorta and IVC unremarkable. No portal venous gas. There is no adenopathy. Reproductive: Prostatectomy. Other: Small fat containing umbilical hernia. Musculoskeletal: No acute osseous pathology. There is heterotopic calcification adjacent to the left ischial tuberosity. IMPRESSION: 1. Small-bowel obstruction with transition in the right lower abdomen similar to prior CT. 2. Fatty liver. Electronically Signed   By: Anner Crete M.D.   On: 09/01/2019 17:42   DG Abd Portable 1V  Result Date: 09/01/2019 CLINICAL DATA:  Status post nasogastric tube placement. EXAM: PORTABLE ABDOMEN - 1 VIEW COMPARISON:  05/03/2019 FINDINGS: Nasogastric tube tip overlies the level of the mid stomach. Contrast  is identified within the renal collecting systems following CT exam earlier today. Dilated bowel loops are identified within the abdomen. No evidence for free intraperitoneal air. IMPRESSION: Nasogastric tube tip overlying the level of the mid stomach. Electronically Signed   By: Nolon Nations M.D.   On: 09/01/2019 19:33    Review of Systems  Constitutional: Negative.   HENT: Negative.   Eyes: Negative.   Respiratory: Negative.   Cardiovascular: Negative.   Gastrointestinal: Positive for abdominal pain, nausea and vomiting.  Endocrine: Negative.   Genitourinary: Negative.   Musculoskeletal: Negative.   Skin: Negative.   Allergic/Immunologic: Negative.   Neurological: Negative.   Hematological: Negative.   Psychiatric/Behavioral: Negative.    Blood pressure 126/75, pulse 68, temperature 98 F (36.7 C), temperature source Oral, resp. rate 18, height 5\' 10"  (1.778 m), weight 83.9 kg, SpO2 100 %. Physical Exam Constitutional:      General: He is not in acute distress.    Appearance: He is well-developed. He is not ill-appearing.  HENT:     Head: Normocephalic and atraumatic.     Comments: Ears and nose normal Eyes:     General: No scleral icterus.    Extraocular Movements: Extraocular  movements intact.     Pupils: Pupils are equal, round, and reactive to light.  Cardiovascular:     Rate and Rhythm: Normal rate and regular rhythm.     Heart sounds: Normal heart sounds. No murmur heard.      Comments: No pitting edema of lower extremities Pulmonary:     Effort: Pulmonary effort is normal. No respiratory distress.     Breath sounds: Normal breath sounds.  Abdominal:     General: Abdomen is flat.     Palpations: Abdomen is soft.     Tenderness: There is abdominal tenderness in the right lower quadrant.     Hernia: No hernia is present.  Lymphadenopathy:     Comments: No groin or cervical lymphadenopathy  Skin:    General: Skin is warm and dry.     Findings: No rash.   Neurological:     General: No focal deficit present.     Mental Status: He is alert.  Psychiatric:        Mood and Affect: Mood normal.        Behavior: Behavior normal.     Assessment/Plan: The patient appears to have a small bowel obstruction.  I would agree with NG tube decompression and bowel rest.  We will start him on the small bowel protocol this morning and monitor closely.  If he does not improve then he may require surgical exploration.  Autumn Messing III 09/02/2019, 7:27 AM

## 2019-09-02 NOTE — Progress Notes (Signed)
PROGRESS NOTE    Johnny Burns  HCW:[id number] DOB: Mar 25, 1960 DOA: 09/01/2019 PCP: Shirline Frees, MD   Brief Narrative:  HPI per Dr. Orma Flaming on 09/01/19 Johnny Burns is a 59 y.o. male with medical history significant of prostate cancer s/p prostatectomy and hyperlipidemia. He states early this AM he started to have diffuse stomach pain that got progressively worse as the day went on. Pain is diffuse and rated as a 7/10 and described as "grabbing and cramping" with intermittent severity. Nothing makes it better or worse. He had a few peanut butter crackers this AM, but that is all he could eat. He denies any nausea/vomiting or diarrhea. He denies any blood in his stool. Had a small BM this AM and has been passing gas throughout the day.  No fever/chills. No shortness of breath or cough.   He has had no other recent surgeries since his SBO in March of this year. Was admitted here for SBO in pelvis area in march. NG tube was placed and patient was managed with conservative therapy with resolution.   Has not had covid vaccines, but has had covid infection.   ED Course: vitals stable on arrival. Labs all unremarkable. CT of abdo/pelvis showed small bowel obstruction with transition in the right lower abdomen similar to prior CT and fatty liver. He does have thickening of the distal and terminal ilieum. NG tube place  With abdo xray to verify placement.   **Interim History  NG tube was placed yesterday and he is doing better. Surgery consulted and planning to do a small bowel protocol. Continuing supportive care and n.p.o.  Assessment & Plan:   Active Problems:   Prostate cancer ([place])   SBO (small bowel obstruction) ([facility])   Hyperlipidemia  SBO -CT of the Abd/Pelvis showed "Small-bowel obstruction with transition in the right lower abdomen similar to prior CT. Fatty liver." -C/w NGT Decompression, Bowel Rest, IVF, Antiemetics and Supportive Care -General Surgery consulted and  appreciate assistance and they recommend starting him on a small bowel protocol this morning and monitoring closely and if he does not improve that he may require surgical exploration -recurrent SBO in same area as previous one in march.  -NG tube placed and patient to remain NPO and will keep him n.p.o. for now -Continue with IVF with normal saline at 100 MLS per hour and electrolyte monitoring. -Continue with IV pain medications with IV morphine 2 mg IV every 2 as needed as well as antiemetics p.o./IV ondansetron 4 mg every 6 as needed for nausea -Ambulate -Ensure potassium is greater than 4 and magnesium greater than 2 -Given 500 mL -SCDs in case of surgery we have added heparin 5000 units subcu every 8 hours -Follow-up on small bowel protocol  Hyperlipidemia -NPO, resume home medication of lipitor and fenofibrate when tolerating PO diet.  GERD -Holding po Pantoprazole 40 mg po qHS and will start him on IV 40 mg nightly  CKD stage IIIa -At baseline -UA was unremarkable -Patient's BUN/creatinine today is now 12/1.16 -We will continue with IV fluid hydration as above -Avoid nephrotoxic medications, contrast dyes, hypotension and renally adjust medications -Repeat CMP in a.m.  Prostate Cancer  -S/p prostatectomy.  -unsure if he had radiation, could be contributing to #1?  -Follow up in the outpatient setting   DVT prophylaxis: SCDs; Will add Heparin 5,000 units sq q8h Code Status: FULL CODE Family Communication: Discussed with wife at bedside  Disposition Plan: Pending tolerance of his diet and improvement of SBO  Status is: Inpatient  Remains inpatient appropriate because:Ongoing diagnostic testing needed not appropriate for outpatient work up, IV treatments appropriate due to intensity of illness or inability to take PO and Inpatient level of care appropriate due to severity of illness   Dispo: The patient is from: Home              Anticipated d/c is to: Home               Anticipated d/c date is: 2 days              Patient currently is not medically stable to d/c.  Consultants:   General Surgery    Procedures:  NGT  Antimicrobials:  Anti-infectives (From admission, onward)   None     Subjective: Seen and examined at bedside and he has some mild abdominal tenderness. Has NG tube and feels better than yesterday. Connected to suction. No chest pain, lightheadedness or dizziness. No other concerns requested this time.  Objective: Vitals:   09/01/19 1800 09/01/19 2025 09/02/19 0137 09/02/19 0545  BP: 122/81 120/72 105/68 126/75  Pulse: 66 66 63 68  Resp: 16 20 16 18   Temp:  98 F (36.7 C) 98.3 F (36.8 C) 98 F (36.7 C)  TempSrc:  Oral Oral Oral  SpO2: 100% 98% 96% 100%  Weight:      Height:        Intake/Output Summary (Last 24 hours) at 09/02/2019 0849 Last data filed at 09/02/2019 0600 Gross per 24 hour  Intake 1447.35 ml  Output 550 ml  Net 897.35 ml   Filed Weights   09/01/19 1508  Weight: 83.9 kg   Examination: Physical Exam:  Constitutional: WN/WD overweight Caucasian male in NAD and appears calm but slightly uncomfortable Eyes: Lids and conjunctivae normal, sclerae anicteric  ENMT: External Ears, Nose appear normal. Grossly normal hearing. NGT hooked to suction Neck: Appears normal, supple, no cervical masses, normal ROM, no appreciable thyromegaly; no JVD Respiratory: Diminished to auscultation bilaterally, no wheezing, rales, rhonchi or crackles. Normal respiratory effort and patient is not tachypenic. No accessory muscle use. Unlabored breathing  Cardiovascular: RRR, no murmurs / rubs / gallops. S1 and S2 auscultated.  Abdomen: Soft, mildly tender, Distended 2/2 body habitus. Bowel sounds diminished.  GU: Deferred. Musculoskeletal: No clubbing / cyanosis of digits/nails. No joint deformity upper and lower extremities. Good ROM, no contractures. Normal strength and muscle tone.  Skin: No rashes, lesions, ulcers on a  limited skin evaluation. No induration; Warm and dry.  Neurologic: CN 2-12 grossly intact with no focal deficits. Romberg sign and cerebellar reflexes not assessed.  Psychiatric: Normal judgment and insight. Alert and oriented x 3. Normal mood and appropriate affect.   Data Reviewed: I have personally reviewed following labs and imaging studies  CBC: Recent Labs  Lab 09/01/19 1512 09/01/19 1951 09/02/19 0623  WBC 9.3 8.5 8.6  HGB 15.2 14.3 13.8  HCT 43.7 41.9 40.6  MCV 91.6 92.5 92.3  PLT 247 223 716   Basic Metabolic Panel: Recent Labs  Lab 09/01/19 1512 09/01/19 1951 09/01/19 2048 09/02/19 0623  NA 138  --  138 138  K 4.3  --  4.0 4.3  CL 105  --  108 108  CO2 26  --  22 22  GLUCOSE 98  --  87 83  BUN 13  --  12 12  CREATININE 1.21 1.28* 1.14 1.16  CALCIUM 8.9  --  8.6* 8.6*   GFR: Estimated Creatinine Clearance:  69 mL/min (by C-G formula based on SCr of 1.16 mg/dL). Liver Function Tests: Recent Labs  Lab 09/01/19 1512  AST 29  ALT 29  ALKPHOS 64  BILITOT 0.9  PROT 6.9  ALBUMIN 4.2   Recent Labs  Lab 09/01/19 1512  LIPASE 26   No results for input(s): AMMONIA in the last 168 hours. Coagulation Profile: No results for input(s): INR, PROTIME in the last 168 hours. Cardiac Enzymes: No results for input(s): CKTOTAL, CKMB, CKMBINDEX, TROPONINI in the last 168 hours. BNP (last 3 results) No results for input(s): PROBNP in the last 8760 hours. HbA1C: No results for input(s): HGBA1C in the last 72 hours. CBG: No results for input(s): GLUCAP in the last 168 hours. Lipid Profile: No results for input(s): CHOL, HDL, LDLCALC, TRIG, CHOLHDL, LDLDIRECT in the last 72 hours. Thyroid Function Tests: No results for input(s): TSH, T4TOTAL, FREET4, T3FREE, THYROIDAB in the last 72 hours. Anemia Panel: No results for input(s): VITAMINB12, FOLATE, FERRITIN, TIBC, IRON, RETICCTPCT in the last 72 hours. Sepsis Labs: No results for input(s): PROCALCITON, LATICACIDVEN  in the last 168 hours.  Recent Results (from the past 240 hour(s))  SARS Coronavirus 2 by RT PCR (hospital order, performed in [facility] hospital lab) Nasopharyngeal Nasopharyngeal Swab     Status: None   Collection Time: 09/01/19  7:51 PM   Specimen: Nasopharyngeal Swab  Result Value Ref Range Status   SARS Coronavirus 2 NEGATIVE NEGATIVE Final    Comment: (NOTE) SARS-CoV-2 target nucleic acids are NOT DETECTED.  The SARS-CoV-2 RNA is generally detectable in upper and lower respiratory specimens during the acute phase of infection. The lowest concentration of SARS-CoV-2 viral copies this assay can detect is 250 copies / mL. A negative result does not preclude SARS-CoV-2 infection and should not be used as the sole basis for treatment or other patient management decisions.  A negative result may occur with improper specimen collection / handling, submission of specimen other than nasopharyngeal swab, presence of viral mutation(s) within the areas targeted by this assay, and inadequate number of viral copies (<250 copies / mL). A negative result must be combined with clinical observations, patient history, and epidemiological information.  Fact Sheet for Patients:   StrictlyIdeas.no  Fact Sheet for Healthcare Providers: BankingDealers.co.za  This test is not yet approved or  cleared by the [place] FDA and has been authorized for detection and/or diagnosis of SARS-CoV-2 by FDA under an Emergency Use Authorization (EUA).  This EUA will remain in effect (meaning this test can be used) for the duration of the COVID-19 declaration under Section 564(b)(1) of the Act, 21 U.S.C. section 360bbb-3(b)(1), unless the authorization is terminated or revoked sooner.  Performed at [facility], [place] [address], [place], [place] [address]      RN Pressure Injury Documentation:     Estimated body mass index is 26.54  kg/m as calculated from the following:   Height as of this encounter: 5\' 10"  (1.778 m).   Weight as of this encounter: 83.9 kg.  Malnutrition Type:      Malnutrition Characteristics:      Nutrition Interventions:    Radiology Studies: CT Abdomen Pelvis W Contrast  Result Date: 09/01/2019 CLINICAL DATA:  59 year old male with history of bowel obstruction presenting with abdominal pain. EXAM: CT ABDOMEN AND PELVIS WITH CONTRAST TECHNIQUE: Multidetector CT imaging of the abdomen and pelvis was performed using the standard protocol following bolus administration of intravenous contrast. CONTRAST:  145mL OMNIPAQUE IOHEXOL 300 MG/ML  SOLN COMPARISON:  CT abdomen pelvis dated 05/03/2019. FINDINGS: Lower chest: There are bibasilar linear atelectasis/scarring. The visualized lung bases are otherwise clear. No intra-abdominal free air. Small free fluid in the lower abdomen. Hepatobiliary: There is background of fatty liver. No intrahepatic biliary ductal dilatation. Cholecystectomy. No retained calcified stone noted in the central CBD. Pancreas: Unremarkable. No pancreatic ductal dilatation or surrounding inflammatory changes. Spleen: Normal in size without focal abnormality. Adrenals/Urinary Tract: The adrenal glands unremarkable. Mild fullness of the renal collecting system and pelvis similar to prior CT. There is bilateral extrarenal pelvis with mild pelviectasis. There is symmetric enhancement and excretion of contrast by both kidneys. The visualized ureters and urinary bladder appear unremarkable. Stomach/Bowel: There is thickening of the distal and terminal ileum. There is mild dilatation of the small bowel loops proximal to the terminal ileum measuring up to 3.1 cm in caliber. A transition is noted in the right lower abdomen (60/2 and 66/4). Findings consistent with a degree of small-bowel obstruction, possibly secondary to inflammatory changes of the distal/terminal ileum with associated wall  thickening and luminal narrowing or secondary to adhesions. This appearance is similar to the prior CT. The colon is collapsed. The appendix is normal. Vascular/Lymphatic: The abdominal aorta and IVC unremarkable. No portal venous gas. There is no adenopathy. Reproductive: Prostatectomy. Other: Small fat containing umbilical hernia. Musculoskeletal: No acute osseous pathology. There is heterotopic calcification adjacent to the left ischial tuberosity. IMPRESSION: 1. Small-bowel obstruction with transition in the right lower abdomen similar to prior CT. 2. Fatty liver. Electronically Signed   By: Anner Crete M.D.   On: 09/01/2019 17:42   DG Abd Portable 1V  Result Date: 09/01/2019 CLINICAL DATA:  Status post nasogastric tube placement. EXAM: PORTABLE ABDOMEN - 1 VIEW COMPARISON:  05/03/2019 FINDINGS: Nasogastric tube tip overlies the level of the mid stomach. Contrast is identified within the renal collecting systems following CT exam earlier today. Dilated bowel loops are identified within the abdomen. No evidence for free intraperitoneal air. IMPRESSION: Nasogastric tube tip overlying the level of the mid stomach. Electronically Signed   By: Nolon Nations M.D.   On: 09/01/2019 19:33   Scheduled Meds: . diatrizoate meglumine-sodium  90 mL Per NG tube Once   Continuous Infusions: . sodium chloride 100 mL/hr at 09/02/19 0555    LOS: 1 day   Kerney Elbe, DO [facility] PAGER is on AMION  If 7PM-7AM, please contact night-coverage [email or web address]

## 2019-09-03 ENCOUNTER — Inpatient Hospital Stay (HOSPITAL_COMMUNITY): Payer: 59

## 2019-09-03 DIAGNOSIS — E162 Hypoglycemia, unspecified: Secondary | ICD-10-CM

## 2019-09-03 LAB — GLUCOSE, CAPILLARY
Glucose-Capillary: 59 mg/dL — ABNORMAL LOW (ref 70–99)
Glucose-Capillary: 101 mg/dL — ABNORMAL HIGH (ref 70–99)

## 2019-09-03 LAB — COMPREHENSIVE METABOLIC PANEL
ALT: 22 U/L (ref 0–44)
AST: 23 U/L (ref 15–41)
Albumin: 3.4 g/dL — ABNORMAL LOW (ref 3.5–5.0)
Alkaline Phosphatase: 55 U/L (ref 38–126)
Anion gap: 12 (ref 5–15)
BUN: 16 mg/dL (ref 8–23)
CO2: 22 mmol/L (ref 22–32)
Calcium: 8.6 mg/dL — ABNORMAL LOW (ref 8.9–10.3)
Chloride: 107 mmol/L (ref 98–111)
Creatinine, Ser: 1.23 mg/dL (ref 0.61–1.24)
GFR calc Af Amer: >60 mL/min (ref >60)
GFR calc non Af Amer: >60 mL/min (ref >60)
Glucose, Bld: 69 mg/dL — ABNORMAL LOW (ref 70–99)
Potassium: 3.9 mmol/L (ref 3.5–5.1)
Sodium: 141 mmol/L (ref 135–145)
Total Bilirubin: 1.3 mg/dL — ABNORMAL HIGH (ref 0.3–1.2)
Total Protein: 5.8 g/dL — ABNORMAL LOW (ref 6.5–8.1)

## 2019-09-03 LAB — CBC WITH DIFFERENTIAL/PLATELET
Abs Immature Granulocytes: 0.06 10*3/uL (ref 0.00–0.07)
Basophils Absolute: 0.1 10*3/uL (ref 0.0–0.1)
Basophils Relative: 1 %
Eosinophils Absolute: 0.1 10*3/uL (ref 0.0–0.5)
Eosinophils Relative: 2 %
HCT: 39.8 % (ref 39.0–52.0)
Hemoglobin: 13.3 g/dL (ref 13.0–17.0)
Immature Granulocytes: 1 %
Lymphocytes Relative: 20 %
Lymphs Abs: 1.4 10*3/uL (ref 0.7–4.0)
MCH: 30.9 pg (ref 26.0–34.0)
MCHC: 33.4 g/dL (ref 30.0–36.0)
MCV: 92.6 fL (ref 80.0–100.0)
Monocytes Absolute: 0.6 10*3/uL (ref 0.1–1.0)
Monocytes Relative: 9 %
Neutro Abs: 4.7 10*3/uL (ref 1.7–7.7)
Neutrophils Relative %: 67 %
Platelets: 302 10*3/uL (ref 150–400)
RBC: 4.3 MIL/uL (ref 4.22–5.81)
RDW: 12.8 % (ref 11.5–15.5)
WBC: 7 10*3/uL (ref 4.0–10.5)
nRBC: 0 % (ref 0.0–0.2)

## 2019-09-03 LAB — MAGNESIUM: Magnesium: 2 mg/dL (ref 1.7–2.4)

## 2019-09-03 LAB — PHOSPHORUS: Phosphorus: 2.8 mg/dL (ref 2.5–4.6)

## 2019-09-03 MED ORDER — GUAIFENESIN ER 600 MG PO TB12: 600.0000 mg | ORAL_TABLET | Freq: Two times a day (BID) | ORAL | 0 refills | Status: DC

## 2019-09-03 MED ORDER — DEXTROSE 50 % IV SOLN
INTRAVENOUS | Status: AC
  Administered 2019-09-03: 12.5 g via INTRAVENOUS
  Filled 2019-09-03: qty 50

## 2019-09-03 MED ORDER — KCL IN DEXTROSE-NACL 40-5-0.9 MEQ/L-%-% IV SOLN
INTRAVENOUS | Status: DC
  Filled 2019-09-03: qty 1000

## 2019-09-03 MED ORDER — ACETAMINOPHEN 325 MG PO TABS
650.0000 mg | ORAL_TABLET | Freq: Four times a day (QID) | ORAL | Status: DC | PRN
  Administered 2019-09-03: 650 mg via ORAL
  Filled 2019-09-03: qty 2

## 2019-09-03 MED ORDER — GUAIFENESIN ER 600 MG PO TB12
1200.0000 mg | ORAL_TABLET | Freq: Two times a day (BID) | ORAL | Status: DC
  Administered 2019-09-03: 1200 mg via ORAL

## 2019-09-03 MED ORDER — DEXTROSE 50 % IV SOLN: 12.5000 g | Freq: Once | INTRAVENOUS | Status: AC

## 2019-09-03 MED ORDER — LORATADINE 5 MG/5ML PO SYRP
10.0000 mg | ORAL_SOLUTION | Freq: Every day | ORAL | Status: AC
  Administered 2019-09-03: 10 mg via ORAL
  Filled 2019-09-03: qty 10

## 2019-09-03 MED ORDER — FLUTICASONE PROPIONATE 50 MCG/ACT NA SUSP: 2.0000 | Freq: Every day | NASAL | 0 refills | Status: DC

## 2019-09-03 NOTE — Discharge Summary (Signed)
Physician Discharge Summary  Johnny Burns JJK:[id number] DOB: 20-May-1960 DOA: 09/01/2019  PCP: Shirline Frees, MD  Admit date: 09/01/2019 Discharge date: 09/03/2019  Admitted From: Home Disposition: Home  Recommendations for Outpatient Follow-up:  1. Follow up with PCP in 1-2 weeks 2. Follow up with General Surgery within 1-2 weeks 3. Please obtain CMP/CBC, Mag, Phos in one week 4. Please follow up on the following pending results:  Home Health: No  Equipment/Devices: None  Discharge Condition: Stable CODE STATUS: FULL CODE Diet recommendation: Soft Heart Healthy Diet   Brief/Interim Summary: HPI per Dr. Orma Flaming on 09/01/19 Johnny Burns a 59 y.o.malewith medical history significant ofprostate cancers/p prostatectomy andhyperlipidemia. He states early this AM he started to have diffuse stomach pain that got progressively worse as the day went on. Pain is diffuse and rated as a 7/10 and described as "grabbing and cramping" with intermittent severity. Nothing makes it better or worse. He had a few peanut butter crackers this AM, but that is all he could eat. He denies any nausea/vomiting or diarrhea. He denies any blood in his stool. Had a small BM this AM and has been passing gas throughout the day.No fever/chills. No shortness of breath or cough.  He has had no other recent surgeries since his SBO in March of this year. Was admitted here for SBO in pelvis area in march. NG tube was placed and patient was managed with conservative therapy with resolution.  Has not had covid vaccines, but has had covid infection.  ED Course:vitals stable on arrival. Labs all unremarkable. CT of abdo/pelvis showed small bowel obstruction with transition in the right lower abdomen similar to prior CT and fatty liver. He does have thickening of the distal and terminal ilieum. NG tube place With abdo xray to verify placement.   **Interim History  NG tube was placed the day before  and he is doing better. Surgery consulted and planning to do a small bowel protocol. Continuing supportive care and n.p.o.  Small bowel protocol done he had contrast in the colon on plain film and general surgery recommending clamping the NG tube and starting full liquids. He does not have any nausea or vomiting they removed the NG tube at 2 PM and he tolerated FULL Liquid Diet. Diet was advanced to soft and he had no issues and was improved and felt as if he could go home. He will need to follow up with General Surgery and PCP now that he is deemed stable to D/C Home.   Discharge Diagnoses:  Active Problems:   Prostate cancer ([place])   SBO (small bowel obstruction) ([facility])   Hyperlipidemia  SBO, improved -CT of the Abd/Pelvis showed "Small-bowel obstruction with transition in the right lower abdomen similar to prior CT. Fatty liver." -Had NGT Decompression, Bowel Rest, IVF, Antiemetics and Supportive Care -General Surgery consulted and appreciate assistance and they recommend starting him on a small bowel protocol this morning and monitoring closely and if he does not improve that he may require surgical exploration -Recurrent SBO in same area as previous one in march.  -NG tube placed and  is a full liquid diet and having NG clamping trial and had NGT removed this Afternoon. Diet was advanced to a Soft Diet and he tolerated it without issue so will D/C home -Continue with IVF with normal saline at 100 MLS per hour and electrolyte monitoring while hospitalized -Continue with IV pain medications with IV morphine 2 mg IV every 2 as needed as well as  antiemetics p.o./IV ondansetron 4 mg every 6 as needed for nausea -Ambulate and he is improved  -Ensure potassium is greater than 4 and magnesium greater than 2 -Given 500 mL bolus the day before yesterday -SCDs in case of surgery we have added heparin 5000 units subcu every 8 hours -Follow-up on small bowel protocol he had contrast in his colon is  improving conservatively; General Surgery felt he was ok to D/C  -General Surgery recommends pulling out NG tube at 2 PM and if he is tolerating full liquids and soft solids can D/C home this evening. Nursing stated he had no issues with diet tolerance  And no pain or N/V. Will D/C home tonight -Follow up with General Surgery within 1-2 weeks as well as PCP  Hyperlipidemia -Was NPO and then on a FULL LIQUID; Able to tolerate Soft Diet so will resume home medication of lipitor and fenofibrate when tolerating PO diet.  GERD -Holding po Pantoprazole 40 mg po qHS and will start him on IV 40 mg nightly but ok to go back to home PPI now that he is tolerating po  CKD stage IIIa -At baseline -UA was unremarkable -Patient's BUN/creatinine went from 12/1.16 -> 16/1.23 -We will continue with IV fluid hydration as above -Avoid nephrotoxic medications, contrast dyes, hypotension and renally adjust medications -Repeat CMP within 1 week   Prostate Cancer  -S/p prostatectomy.  -unsure if he had radiation, could be contributing to #1? -Follow up in the outpatient setting    Hyperbilirubinemia -Mild with a T Bili of 1.3 -Continue to Monitor and Trend in the out patient setting -Repeat CMP within 1 week  Hypoglycemia -In setting of poor p.o. intake and not eating -Patient was given half an amp of D50 and also started on D5W plus normal saline at 75 MLS per hour while hospitalized -We will continue to monitor closely but repeat blood sugars have improved and CBG 101 on repeat and he is tolerating his diet will now  Discharge Instructions  Discharge Instructions    Call MD for:  difficulty breathing, headache or visual disturbances   Complete by: As directed    Call MD for:  extreme fatigue   Complete by: As directed    Call MD for:  hives   Complete by: As directed    Call MD for:  persistant dizziness or light-headedness   Complete by: As directed    Call MD for:  persistant nausea  and vomiting   Complete by: As directed    Call MD for:  redness, tenderness, or signs of infection (pain, swelling, redness, odor or green/yellow discharge around incision site)   Complete by: As directed    Call MD for:  severe uncontrolled pain   Complete by: As directed    Call MD for:  temperature >100.4   Complete by: As directed    Diet - low sodium heart healthy   Complete by: As directed    SOFT   Discharge instructions   Complete by: As directed    You were cared for by a hospitalist during your hospital stay. If you have any questions about your discharge medications or the care you received while you were in the hospital after you are discharged, you can call the unit and ask to speak with the hospitalist on call if the hospitalist that took care of you is not available. Once you are discharged, your primary care physician will handle any further medical issues. Please note that NO REFILLS  for any discharge medications will be authorized once you are discharged, as it is imperative that you return to your primary care physician (or establish a relationship with a primary care physician if you do not have one) for your aftercare needs so that they can reassess your need for medications and monitor your lab values.  Follow up with PCP and General Surgery. Take all medications as prescribed. If symptoms change or worsen please return to the ED for evaluation   Increase activity slowly   Complete by: As directed      Allergies as of 09/03/2019   No Known Allergies     Medication List    TAKE these medications   atorvastatin 10 MG tablet Commonly known as: LIPITOR Take 10 mg by mouth every evening.   cetirizine 10 MG tablet Commonly known as: ZYRTEC Take 10 mg by mouth at bedtime.   chlorhexidine 0.12 % solution Commonly known as: PERIDEX Use as directed 5 mLs in the mouth or throat 2 (two) times daily.   fenofibrate 160 MG tablet Take 160 mg by mouth every evening.    fluticasone 50 MCG/ACT nasal spray Commonly known as: FLONASE Place 2 sprays into both nostrils daily.   guaiFENesin 600 MG 12 hr tablet Commonly known as: MUCINEX Take 1 tablet (600 mg total) by mouth 2 (two) times daily.   ondansetron 8 MG tablet Commonly known as: ZOFRAN Take 8 mg by mouth every 8 (eight) hours as needed for nausea or vomiting.   pantoprazole 40 MG tablet Commonly known as: PROTONIX Take 40 mg by mouth at bedtime.   polyvinyl alcohol 1.4 % ophthalmic solution Commonly known as: LIQUIFILM TEARS Place 1 drop into both eyes as needed for dry eyes.   potassium chloride SA 20 MEQ tablet Commonly known as: KLOR-CON Take 1 tablet (20 mEq total) by mouth daily for 5 days.   vitamin C 1000 MG tablet Take 1,000 mg by mouth daily.   ZINC PO Take 1 tablet by mouth daily.       No Known Allergies  Consultations:  General Surgery   Procedures/Studies: CT Abdomen Pelvis W Contrast  Result Date: 09/01/2019 CLINICAL DATA:  59 year old male with history of bowel obstruction presenting with abdominal pain. EXAM: CT ABDOMEN AND PELVIS WITH CONTRAST TECHNIQUE: Multidetector CT imaging of the abdomen and pelvis was performed using the standard protocol following bolus administration of intravenous contrast. CONTRAST:  134mL OMNIPAQUE IOHEXOL 300 MG/ML  SOLN COMPARISON:  CT abdomen pelvis dated 05/03/2019. FINDINGS: Lower chest: There are bibasilar linear atelectasis/scarring. The visualized lung bases are otherwise clear. No intra-abdominal free air. Small free fluid in the lower abdomen. Hepatobiliary: There is background of fatty liver. No intrahepatic biliary ductal dilatation. Cholecystectomy. No retained calcified stone noted in the central CBD. Pancreas: Unremarkable. No pancreatic ductal dilatation or surrounding inflammatory changes. Spleen: Normal in size without focal abnormality. Adrenals/Urinary Tract: The adrenal glands unremarkable. Mild fullness of the renal  collecting system and pelvis similar to prior CT. There is bilateral extrarenal pelvis with mild pelviectasis. There is symmetric enhancement and excretion of contrast by both kidneys. The visualized ureters and urinary bladder appear unremarkable. Stomach/Bowel: There is thickening of the distal and terminal ileum. There is mild dilatation of the small bowel loops proximal to the terminal ileum measuring up to 3.1 cm in caliber. A transition is noted in the right lower abdomen (60/2 and 66/4). Findings consistent with a degree of small-bowel obstruction, possibly secondary to inflammatory changes of the distal/terminal  ileum with associated wall thickening and luminal narrowing or secondary to adhesions. This appearance is similar to the prior CT. The colon is collapsed. The appendix is normal. Vascular/Lymphatic: The abdominal aorta and IVC unremarkable. No portal venous gas. There is no adenopathy. Reproductive: Prostatectomy. Other: Small fat containing umbilical hernia. Musculoskeletal: No acute osseous pathology. There is heterotopic calcification adjacent to the left ischial tuberosity. IMPRESSION: 1. Small-bowel obstruction with transition in the right lower abdomen similar to prior CT. 2. Fatty liver. Electronically Signed   By: Anner Crete M.D.   On: 09/01/2019 17:42   DG Abd Portable 1V-Small Bowel Obstruction Protocol-24 hr delay  Result Date: 09/03/2019 CLINICAL DATA:  Small bowel obstruction EXAM: PORTABLE ABDOMEN - 1 VIEW COMPARISON:  Yesterday FINDINGS: No gas dilated bowel is seen. Oral contrast outlines the nondilated colon. The remaining abdomen is relatively gasless. No concerning mass effect or gas collection. IMPRESSION: Oral contrast is seen within nondilated colon. The small bowel is essentially gasless. Electronically Signed   By: Monte Fantasia M.D.   On: 09/03/2019 06:01   DG Abd Portable 1V-Small Bowel Obstruction Protocol-initial, 8 hr delay  Result Date: 09/02/2019 CLINICAL  DATA:  Small bowel obstruction. EXAM: PORTABLE ABDOMEN - 1 VIEW COMPARISON:  09/01/2018 FINDINGS: Bowel gas pattern is nonobstructive. Contrast is present throughout the colon. Remainder the exam is unchanged. IMPRESSION: Nonobstructive bowel gas pattern. Electronically Signed   By: Marin Olp M.D.   On: 09/02/2019 21:14   DG Abd Portable 1V  Result Date: 09/01/2019 CLINICAL DATA:  Status post nasogastric tube placement. EXAM: PORTABLE ABDOMEN - 1 VIEW COMPARISON:  05/03/2019 FINDINGS: Nasogastric tube tip overlies the level of the mid stomach. Contrast is identified within the renal collecting systems following CT exam earlier today. Dilated bowel loops are identified within the abdomen. No evidence for free intraperitoneal air. IMPRESSION: Nasogastric tube tip overlying the level of the mid stomach. Electronically Signed   By: Nolon Nations M.D.   On: 09/01/2019 19:33     Subjective: Seen and examined at bedside and states he does not have any abdominal discomfort or nausea vomiting.  States his mild abdominal tenderness is improved however his main complaint is a headache and he feels it is from his sinuses.  NG tube has been clamped and he is trying full liquids.  Able to tolerate his grape juice and broth.  No chest pain or shortness breath.  No other concerns or points at this time and his NGT was removed and he tolerated a Soft Diet without issues so is stable to D/C Home and follow up with PCP and General Surgery in the outpatient setting.  Discharge Exam: Vitals:   09/03/19 0608 09/03/19 1407  BP: 121/66 118/70  Pulse: 67 67  Resp: 17 17  Temp: 98.3 F (36.8 C) 97.7 F (36.5 C)  SpO2: 98% 100%   Vitals:   09/02/19 1324 09/02/19 2118 09/03/19 0608 09/03/19 1407  BP: 118/71 (!) 149/82 121/66 118/70  Pulse: 61 80 67 67  Resp: 16 15 17 17   Temp: 98.2 F (36.8 C) 98.2 F (36.8 C) 98.3 F (36.8 C) 97.7 F (36.5 C)  TempSrc: Oral Oral Oral Oral  SpO2: 100% 100% 98% 100%   Weight:      Height:       Examination early this AM: Physical Exam:  Constitutional: WN/WD male currently no acute distress appears calm and more comfortable. Eyes: Lids and conjunctivae normal, sclerae anicteric  ENMT: External Ears, Nose appear normal. Grossly  normal hearing.  NG tube is in place however if not connected to suction Neck: Appears normal, supple, no cervical masses, normal ROM, no appreciable thyromegaly; no JVD Respiratory: Diminished to auscultation bilaterally, no wheezing, rales, rhonchi or crackles. Normal respiratory effort and patient is not tachypenic. No accessory muscle use.  Unlabored breathing Cardiovascular: RRR, no murmurs / rubs / gallops. S1 and S2 auscultated. No extremity edema. Abdomen: Soft, non-tender, slightly distended. Bowel sounds positive but are slightly diminished.  GU: Deferred. Musculoskeletal: No clubbing / cyanosis of digits/nails. No joint deformity upper and lower extremities.  Skin: No rashes, lesions, ulcers on limited skin evaluation. No induration; Warm and dry.  Neurologic: CN 2-12 grossly intact with no focal deficits. Romberg sign and cerebellar reflexes not assessed.  Psychiatric: Normal judgment and insight. Alert and oriented x 3. Normal mood and appropriate affect.   The results of significant diagnostics from this hospitalization (including imaging, microbiology, ancillary and laboratory) are listed below for reference.    Microbiology: Recent Results (from the past 240 hour(s))  SARS Coronavirus 2 by RT PCR (hospital order, performed in [facility] hospital lab) Nasopharyngeal Nasopharyngeal Swab     Status: None   Collection Time: 09/01/19  7:51 PM   Specimen: Nasopharyngeal Swab  Result Value Ref Range Status   SARS Coronavirus 2 NEGATIVE NEGATIVE Final    Comment: (NOTE) SARS-CoV-2 target nucleic acids are NOT DETECTED.  The SARS-CoV-2 RNA is generally detectable in upper and lower respiratory specimens during  the acute phase of infection. The lowest concentration of SARS-CoV-2 viral copies this assay can detect is 250 copies / mL. A negative result does not preclude SARS-CoV-2 infection and should not be used as the sole basis for treatment or other patient management decisions.  A negative result may occur with improper specimen collection / handling, submission of specimen other than nasopharyngeal swab, presence of viral mutation(s) within the areas targeted by this assay, and inadequate number of viral copies (<250 copies / mL). A negative result must be combined with clinical observations, patient history, and epidemiological information.  Fact Sheet for Patients:   StrictlyIdeas.no  Fact Sheet for Healthcare Providers: BankingDealers.co.za  This test is not yet approved or  cleared by the [place] FDA and has been authorized for detection and/or diagnosis of SARS-CoV-2 by FDA under an Emergency Use Authorization (EUA).  This EUA will remain in effect (meaning this test can be used) for the duration of the COVID-19 declaration under Section 564(b)(1) of the Act, 21 U.S.C. section 360bbb-3(b)(1), unless the authorization is terminated or revoked sooner.  Performed at [facility], [place] [address], [place], [place] [address]     Labs: BNP (last 3 results) No results for input(s): BNP in the last 8760 hours. Basic Metabolic Panel: Recent Labs  Lab 09/01/19 1512 09/01/19 1951 09/01/19 2048 09/02/19 0623 09/03/19 0529  NA 138  --  138 138 141  K 4.3  --  4.0 4.3 3.9  CL 105  --  108 108 107  CO2 26  --  22 22 22   GLUCOSE 98  --  87 83 69*  BUN 13  --  12 12 16   CREATININE 1.21 1.28* 1.14 1.16 1.23  CALCIUM 8.9  --  8.6* 8.6* 8.6*  MG  --   --   --   --  2.0  PHOS  --   --   --   --  2.8   Liver Function Tests: Recent Labs  Lab  09/01/19 1512 09/03/19 0529  AST 29 23  ALT 29 22  ALKPHOS 64 55   BILITOT 0.9 1.3*  PROT 6.9 5.8*  ALBUMIN 4.2 3.4*   Recent Labs  Lab 09/01/19 1512  LIPASE 26   No results for input(s): AMMONIA in the last 168 hours. CBC: Recent Labs  Lab 09/01/19 1512 09/01/19 1951 09/02/19 0623 09/03/19 0529  WBC 9.3 8.5 8.6 7.0  NEUTROABS  --   --   --  4.7  HGB 15.2 14.3 13.8 13.3  HCT 43.7 41.9 40.6 39.8  MCV 91.6 92.5 92.3 92.6  PLT 247 223 230 302   Cardiac Enzymes: No results for input(s): CKTOTAL, CKMB, CKMBINDEX, TROPONINI in the last 168 hours. BNP: Invalid input(s): POCBNP CBG: Recent Labs  Lab 09/03/19 0729 09/03/19 0752  GLUCAP 59* 101*   D-Dimer No results for input(s): DDIMER in the last 72 hours. Hgb A1c No results for input(s): HGBA1C in the last 72 hours. Lipid Profile No results for input(s): CHOL, HDL, LDLCALC, TRIG, CHOLHDL, LDLDIRECT in the last 72 hours. Thyroid function studies No results for input(s): TSH, T4TOTAL, T3FREE, THYROIDAB in the last 72 hours.  Invalid input(s): FREET3 Anemia work up No results for input(s): VITAMINB12, FOLATE, FERRITIN, TIBC, IRON, RETICCTPCT in the last 72 hours. Urinalysis    Component Value Date/Time   COLORURINE YELLOW 09/01/2019 1512   APPEARANCEUR CLEAR 09/01/2019 1512   LABSPEC 1.021 09/01/2019 1512   PHURINE 6.0 09/01/2019 1512   GLUCOSEU NEGATIVE 09/01/2019 1512   HGBUR NEGATIVE 09/01/2019 1512   BILIRUBINUR NEGATIVE 09/01/2019 1512   KETONESUR NEGATIVE 09/01/2019 1512   PROTEINUR NEGATIVE 09/01/2019 1512   UROBILINOGEN 0.2 10/09/2012 0440   NITRITE NEGATIVE 09/01/2019 1512   LEUKOCYTESUR NEGATIVE 09/01/2019 1512   Sepsis Labs Invalid input(s): PROCALCITONIN,  WBC,  LACTICIDVEN Microbiology Recent Results (from the past 240 hour(s))  SARS Coronavirus 2 by RT PCR (hospital order, performed in [place] hospital lab) Nasopharyngeal Nasopharyngeal Swab     Status: None   Collection Time: 09/01/19  7:51 PM   Specimen: Nasopharyngeal Swab  Result Value Ref Range  Status   SARS Coronavirus 2 NEGATIVE NEGATIVE Final    Comment: (NOTE) SARS-CoV-2 target nucleic acids are NOT DETECTED.  The SARS-CoV-2 RNA is generally detectable in upper and lower respiratory specimens during the acute phase of infection. The lowest concentration of SARS-CoV-2 viral copies this assay can detect is 250 copies / mL. A negative result does not preclude SARS-CoV-2 infection and should not be used as the sole basis for treatment or other patient management decisions.  A negative result may occur with improper specimen collection / handling, submission of specimen other than nasopharyngeal swab, presence of viral mutation(s) within the areas targeted by this assay, and inadequate number of viral copies (<250 copies / mL). A negative result must be combined with clinical observations, patient history, and epidemiological information.  Fact Sheet for Patients:   StrictlyIdeas.no  Fact Sheet for Healthcare Providers: BankingDealers.co.za  This test is not yet approved or  cleared by the [place] FDA and has been authorized for detection and/or diagnosis of SARS-CoV-2 by FDA under an Emergency Use Authorization (EUA).  This EUA will remain in effect (meaning this test can be used) for the duration of the COVID-19 declaration under Section 564(b)(1) of the Act, 21 U.S.C. section 360bbb-3(b)(1), unless the authorization is terminated or revoked sooner.  Performed at [facility], [place] [address], [place], [place] [address]    Time  coordinating discharge: 35 minutes  SIGNED:  Kerney Elbe, DO [facility] 09/03/2019, 6:48 PM Pager is on [place]  If 7PM-7AM, please contact night-coverage [email or web address]

## 2019-09-03 NOTE — Progress Notes (Signed)
Subjective/Chief Complaint: No n/v.  Had some flatus.  Contrast in colon on plain film   Objective: Vital signs in last 24 hours: Temp:  [97.9 F (36.6 C)-98.3 F (36.8 C)] 98.3 F (36.8 C) (07/06 2595) Pulse Rate:  [60-80] 67 (07/06 0608) Resp:  [15-17] 17 (07/06 0608) BP: (103-149)/(59-82) 121/66 (07/06 0608) SpO2:  [98 %-100 %] 98 % (07/06 0608) Last BM Date: 08/31/19  Intake/Output from previous day: 07/05 0701 - 07/06 0700 In: 1200 [I.V.:1100] Out: 1485 [Urine:725; Emesis/NG output:760] Intake/Output this shift: No intake/output data recorded.  General appearance: alert, cooperative and mild distress Resp: breathing comfortably GI: soft, non tender, non distended. Extremities: extremities normal, atraumatic, no cyanosis or edema NGT in place.  Bilious output in canister, current tube contents more clear/brownish.   Lab Results:  Recent Labs    09/02/19 0623 09/03/19 0529  WBC 8.6 7.0  HGB 13.8 13.3  HCT 40.6 39.8  PLT 230 302   BMET Recent Labs    09/02/19 0623 09/03/19 0529  NA 138 141  K 4.3 3.9  CL 108 107  CO2 22 22  GLUCOSE 83 69*  BUN 12 16  CREATININE 1.16 1.23  CALCIUM 8.6* 8.6*   PT/INR No results for input(s): LABPROT, INR in the last 72 hours. ABG No results for input(s): PHART, HCO3 in the last 72 hours.  Invalid input(s): PCO2, PO2  Studies/Results: CT Abdomen Pelvis W Contrast  Result Date: 09/01/2019 CLINICAL DATA:  59 year old male with history of bowel obstruction presenting with abdominal pain. EXAM: CT ABDOMEN AND PELVIS WITH CONTRAST TECHNIQUE: Multidetector CT imaging of the abdomen and pelvis was performed using the standard protocol following bolus administration of intravenous contrast. CONTRAST:  151mL OMNIPAQUE IOHEXOL 300 MG/ML  SOLN COMPARISON:  CT abdomen pelvis dated 05/03/2019. FINDINGS: Lower chest: There are bibasilar linear atelectasis/scarring. The visualized lung bases are otherwise clear. No intra-abdominal  free air. Small free fluid in the lower abdomen. Hepatobiliary: There is background of fatty liver. No intrahepatic biliary ductal dilatation. Cholecystectomy. No retained calcified stone noted in the central CBD. Pancreas: Unremarkable. No pancreatic ductal dilatation or surrounding inflammatory changes. Spleen: Normal in size without focal abnormality. Adrenals/Urinary Tract: The adrenal glands unremarkable. Mild fullness of the renal collecting system and pelvis similar to prior CT. There is bilateral extrarenal pelvis with mild pelviectasis. There is symmetric enhancement and excretion of contrast by both kidneys. The visualized ureters and urinary bladder appear unremarkable. Stomach/Bowel: There is thickening of the distal and terminal ileum. There is mild dilatation of the small bowel loops proximal to the terminal ileum measuring up to 3.1 cm in caliber. A transition is noted in the right lower abdomen (60/2 and 66/4). Findings consistent with a degree of small-bowel obstruction, possibly secondary to inflammatory changes of the distal/terminal ileum with associated wall thickening and luminal narrowing or secondary to adhesions. This appearance is similar to the prior CT. The colon is collapsed. The appendix is normal. Vascular/Lymphatic: The abdominal aorta and IVC unremarkable. No portal venous gas. There is no adenopathy. Reproductive: Prostatectomy. Other: Small fat containing umbilical hernia. Musculoskeletal: No acute osseous pathology. There is heterotopic calcification adjacent to the left ischial tuberosity. IMPRESSION: 1. Small-bowel obstruction with transition in the right lower abdomen similar to prior CT. 2. Fatty liver. Electronically Signed   By: Anner Crete M.D.   On: 09/01/2019 17:42   DG Abd Portable 1V-Small Bowel Obstruction Protocol-24 hr delay  Result Date: 09/03/2019 CLINICAL DATA:  Small bowel obstruction EXAM: PORTABLE  ABDOMEN - 1 VIEW COMPARISON:  Yesterday FINDINGS: No gas  dilated bowel is seen. Oral contrast outlines the nondilated colon. The remaining abdomen is relatively gasless. No concerning mass effect or gas collection. IMPRESSION: Oral contrast is seen within nondilated colon. The small bowel is essentially gasless. Electronically Signed   By: Monte Fantasia M.D.   On: 09/03/2019 06:01   DG Abd Portable 1V-Small Bowel Obstruction Protocol-initial, 8 hr delay  Result Date: 09/02/2019 CLINICAL DATA:  Small bowel obstruction. EXAM: PORTABLE ABDOMEN - 1 VIEW COMPARISON:  09/01/2018 FINDINGS: Bowel gas pattern is nonobstructive. Contrast is present throughout the colon. Remainder the exam is unchanged. IMPRESSION: Nonobstructive bowel gas pattern. Electronically Signed   By: Marin Olp M.D.   On: 09/02/2019 21:14   DG Abd Portable 1V  Result Date: 09/01/2019 CLINICAL DATA:  Status post nasogastric tube placement. EXAM: PORTABLE ABDOMEN - 1 VIEW COMPARISON:  05/03/2019 FINDINGS: Nasogastric tube tip overlies the level of the mid stomach. Contrast is identified within the renal collecting systems following CT exam earlier today. Dilated bowel loops are identified within the abdomen. No evidence for free intraperitoneal air. IMPRESSION: Nasogastric tube tip overlying the level of the mid stomach. Electronically Signed   By: Nolon Nations M.D.   On: 09/01/2019 19:33    Anti-infectives: Anti-infectives (From admission, onward)   None      Assessment/Plan: s/p * No surgery found * p SBO  Improving conservatively. Contrast in colon. Clamp ngt. Start full liquids. If no n/v, pull NGT at 2 pm. Then can do full liquids/soft solids and go home later this evening.     LOS: 2 days    Stark Klein 09/03/2019

## 2019-09-03 NOTE — Progress Notes (Signed)
PROGRESS NOTE    Johnny Burns  RCB:[id number] DOB: 1960/10/31 DOA: 09/01/2019 PCP: Shirline Frees, MD   Brief Narrative:  HPI per Dr. Orma Flaming on 09/01/19 Johnny Burns is a 59 y.o. male with medical history significant of prostate cancer s/p prostatectomy and hyperlipidemia. He states early this AM he started to have diffuse stomach pain that got progressively worse as the day went on. Pain is diffuse and rated as a 7/10 and described as "grabbing and cramping" with intermittent severity. Nothing makes it better or worse. He had a few peanut butter crackers this AM, but that is all he could eat. He denies any nausea/vomiting or diarrhea. He denies any blood in his stool. Had a small BM this AM and has been passing gas throughout the day.  No fever/chills. No shortness of breath or cough.   He has had no other recent surgeries since his SBO in March of this year. Was admitted here for SBO in pelvis area in march. NG tube was placed and patient was managed with conservative therapy with resolution.   Has not had covid vaccines, but has had covid infection.   ED Course: vitals stable on arrival. Labs all unremarkable. CT of abdo/pelvis showed small bowel obstruction with transition in the right lower abdomen similar to prior CT and fatty liver. He does have thickening of the distal and terminal ilieum. NG tube place  With abdo xray to verify placement.   **Interim History  NG tube was placed the day before and he is doing better. Surgery consulted and planning to do a small bowel protocol. Continuing supportive care and n.p.o.  Small bowel protocol done he had contrast in the colon on plain film and general surgery recommending clamping the NG tube and starting full liquids.  He does not have any nausea or vomiting they can remove the NG tube at 2 PM negative for liquid/soft solids and possibly even go home later this evening.  Assessment & Plan:   Active Problems:   Prostate  cancer ([place])   SBO (small bowel obstruction) ([facility])   Hyperlipidemia  SBO, improving -CT of the Abd/Pelvis showed "Small-bowel obstruction with transition in the right lower abdomen similar to prior CT. Fatty liver." -C/w NGT Decompression, Bowel Rest, IVF, Antiemetics and Supportive Care -General Surgery consulted and appreciate assistance and they recommend starting him on a small bowel protocol this morning and monitoring closely and if he does not improve that he may require surgical exploration -Recurrent SBO in same area as previous one in march.  -NG tube placed and patient to remain NPO and now is a full liquid diet and having NG clamping trial -Continue with IVF with normal saline at 100 MLS per hour and electrolyte monitoring. -Continue with IV pain medications with IV morphine 2 mg IV every 2 as needed as well as antiemetics p.o./IV ondansetron 4 mg every 6 as needed for nausea -Ambulate -Ensure potassium is greater than 4 and magnesium greater than 2 -Given 500 mL bolus the day before yesterday -SCDs in case of surgery we have added heparin 5000 units subcu every 8 hours -Follow-up on small bowel protocol he had contrast in his colon is improving conservatively -Neurosurgery recommends pulling out NG tube at 2 PM and if he is tolerating full liquids and soft solids possible later discharge this evening  Hyperlipidemia -NPO, resume home medication of lipitor and fenofibrate when tolerating PO diet.  GERD -Holding po Pantoprazole 40 mg po qHS and will start him on  IV 40 mg nightly  CKD stage IIIa -At baseline -UA was unremarkable -Patient's BUN/creatinine went from 12/1.16 -> 16/1.23 -We will continue with IV fluid hydration as above -Avoid nephrotoxic medications, contrast dyes, hypotension and renally adjust medications -Repeat CMP in a.m.  Prostate Cancer  -S/p prostatectomy.  -unsure if he had radiation, could be contributing to #1?  -Follow up in the outpatient  setting    Hyperbilirubinemia -Mild with a T Bili of 1.3 -Continue to Monitor and Trend -Repeat CMP in the AM  If not discharged later this evening  Hypoglycemia -In setting of poor p.o. intake and not eating -Patient was given half an amp of D50 and also started on D5W plus normal saline at 75 MLS per hour -We will continue to monitor closely but repeat blood sugars have improved and CBG 101 on repeat  DVT prophylaxis: SCDs; Will add Heparin 5,000 units sq q8h Code Status: FULL CODE Family Communication: Discussed with wife at bedside again  Disposition Plan: Pending tolerance of his diet and improvement of SBO  Status is: Inpatient  Remains inpatient appropriate because:Ongoing diagnostic testing needed not appropriate for outpatient work up, IV treatments appropriate due to intensity of illness or inability to take PO and Inpatient level of care appropriate due to severity of illness   Dispo: The patient is from: Home              Anticipated d/c is to: Home              Anticipated d/c date is: 1 day              Patient currently is not medically stable to d/c.  Consultants:   General Surgery    Procedures:  NGT  Antimicrobials:  Anti-infectives (From admission, onward)   None     Subjective: Seen and examined at bedside and states he does not have any abdominal discomfort or nausea vomiting.  States his mild abdominal tenderness is improved however his main complaint is a headache and he feels it is from his sinuses.  NG tube has been clamped and he is trying full liquids.  Able to tolerate his grape juice and broth.  No chest pain or shortness breath.  No other concerns or points at this time and if he improves an NG tube is removed may be able to go home later this evening.  Objective: Vitals:   09/02/19 0952 09/02/19 1324 09/02/19 2118 09/03/19 0608  BP: (!) 103/59 118/71 (!) 149/82 121/66  Pulse: 60 61 80 67  Resp: 16 16 15 17   Temp: 97.9 F (36.6 C) 98.2  F (36.8 C) 98.2 F (36.8 C) 98.3 F (36.8 C)  TempSrc: Oral Oral Oral Oral  SpO2: 99% 100% 100% 98%  Weight:      Height:        Intake/Output Summary (Last 24 hours) at 09/03/2019 1137 Last data filed at 09/03/2019 1052 Gross per 24 hour  Intake 1500 ml  Output 1485 ml  Net 15 ml   Filed Weights   09/01/19 1508  Weight: 83.9 kg   Examination: Physical Exam:  Constitutional: WN/WD male currently no acute distress appears calm and more comfortable. Eyes: Lids and conjunctivae normal, sclerae anicteric  ENMT: External Ears, Nose appear normal. Grossly normal hearing.  NG tube is in place however if not connected to suction Neck: Appears normal, supple, no cervical masses, normal ROM, no appreciable thyromegaly; no JVD Respiratory: Diminished to auscultation bilaterally, no wheezing, rales,  rhonchi or crackles. Normal respiratory effort and patient is not tachypenic. No accessory muscle use.  Unlabored breathing Cardiovascular: RRR, no murmurs / rubs / gallops. S1 and S2 auscultated. No extremity edema. Abdomen: Soft, non-tender, slightly distended. Bowel sounds positive but are slightly diminished.  GU: Deferred. Musculoskeletal: No clubbing / cyanosis of digits/nails. No joint deformity upper and lower extremities.  Skin: No rashes, lesions, ulcers on limited skin evaluation. No induration; Warm and dry.  Neurologic: CN 2-12 grossly intact with no focal deficits. Romberg sign and cerebellar reflexes not assessed.  Psychiatric: Normal judgment and insight. Alert and oriented x 3. Normal mood and appropriate affect.   Data Reviewed: I have personally reviewed following labs and imaging studies  CBC: Recent Labs  Lab 09/01/19 1512 09/01/19 1951 09/02/19 0623 09/03/19 0529  WBC 9.3 8.5 8.6 7.0  NEUTROABS  --   --   --  4.7  HGB 15.2 14.3 13.8 13.3  HCT 43.7 41.9 40.6 39.8  MCV 91.6 92.5 92.3 92.6  PLT 247 223 230 427   Basic Metabolic Panel: Recent Labs  Lab  09/01/19 1512 09/01/19 1951 09/01/19 2048 09/02/19 0623 09/03/19 0529  NA 138  --  138 138 141  K 4.3  --  4.0 4.3 3.9  CL 105  --  108 108 107  CO2 26  --  22 22 22   GLUCOSE 98  --  87 83 69*  BUN 13  --  12 12 16   CREATININE 1.21 1.28* 1.14 1.16 1.23  CALCIUM 8.9  --  8.6* 8.6* 8.6*  MG  --   --   --   --  2.0  PHOS  --   --   --   --  2.8   GFR: Estimated Creatinine Clearance: 65.1 mL/min (by C-G formula based on SCr of 1.23 mg/dL). Liver Function Tests: Recent Labs  Lab 09/01/19 1512 09/03/19 0529  AST 29 23  ALT 29 22  ALKPHOS 64 55  BILITOT 0.9 1.3*  PROT 6.9 5.8*  ALBUMIN 4.2 3.4*   Recent Labs  Lab 09/01/19 1512  LIPASE 26   No results for input(s): AMMONIA in the last 168 hours. Coagulation Profile: No results for input(s): INR, PROTIME in the last 168 hours. Cardiac Enzymes: No results for input(s): CKTOTAL, CKMB, CKMBINDEX, TROPONINI in the last 168 hours. BNP (last 3 results) No results for input(s): PROBNP in the last 8760 hours. HbA1C: No results for input(s): HGBA1C in the last 72 hours. CBG: Recent Labs  Lab 09/03/19 0729 09/03/19 0752  GLUCAP 59* 101*   Lipid Profile: No results for input(s): CHOL, HDL, LDLCALC, TRIG, CHOLHDL, LDLDIRECT in the last 72 hours. Thyroid Function Tests: No results for input(s): TSH, T4TOTAL, FREET4, T3FREE, THYROIDAB in the last 72 hours. Anemia Panel: No results for input(s): VITAMINB12, FOLATE, FERRITIN, TIBC, IRON, RETICCTPCT in the last 72 hours. Sepsis Labs: No results for input(s): PROCALCITON, LATICACIDVEN in the last 168 hours.  Recent Results (from the past 240 hour(s))  SARS Coronavirus 2 by RT PCR (hospital order, performed in [facility] hospital lab) Nasopharyngeal Nasopharyngeal Swab     Status: None   Collection Time: 09/01/19  7:51 PM   Specimen: Nasopharyngeal Swab  Result Value Ref Range Status   SARS Coronavirus 2 NEGATIVE NEGATIVE Final    Comment: (NOTE) SARS-CoV-2 target nucleic  acids are NOT DETECTED.  The SARS-CoV-2 RNA is generally detectable in upper and lower respiratory specimens during the acute phase of infection. The lowest concentration  of SARS-CoV-2 viral copies this assay can detect is 250 copies / mL. A negative result does not preclude SARS-CoV-2 infection and should not be used as the sole basis for treatment or other patient management decisions.  A negative result may occur with improper specimen collection / handling, submission of specimen other than nasopharyngeal swab, presence of viral mutation(s) within the areas targeted by this assay, and inadequate number of viral copies (<250 copies / mL). A negative result must be combined with clinical observations, patient history, and epidemiological information.  Fact Sheet for Patients:   StrictlyIdeas.no  Fact Sheet for Healthcare Providers: BankingDealers.co.za  This test is not yet approved or  cleared by the [place] FDA and has been authorized for detection and/or diagnosis of SARS-CoV-2 by FDA under an Emergency Use Authorization (EUA).  This EUA will remain in effect (meaning this test can be used) for the duration of the COVID-19 declaration under Section 564(b)(1) of the Act, 21 U.S.C. section 360bbb-3(b)(1), unless the authorization is terminated or revoked sooner.  Performed at [facility], [place] [address] [place], [place] [address]      RN Pressure Injury Documentation:     Estimated body mass index is 26.54 kg/m as calculated from the following:   Height as of this encounter: 5\' 10"  (1.778 m).   Weight as of this encounter: 83.9 kg.  Malnutrition Type:      Malnutrition Characteristics:      Nutrition Interventions:    Radiology Studies: CT Abdomen Pelvis W Contrast  Result Date: 09/01/2019 CLINICAL DATA:  59 year old male with history of bowel obstruction presenting with abdominal pain.  EXAM: CT ABDOMEN AND PELVIS WITH CONTRAST TECHNIQUE: Multidetector CT imaging of the abdomen and pelvis was performed using the standard protocol following bolus administration of intravenous contrast. CONTRAST:  191mL OMNIPAQUE IOHEXOL 300 MG/ML  SOLN COMPARISON:  CT abdomen pelvis dated 05/03/2019. FINDINGS: Lower chest: There are bibasilar linear atelectasis/scarring. The visualized lung bases are otherwise clear. No intra-abdominal free air. Small free fluid in the lower abdomen. Hepatobiliary: There is background of fatty liver. No intrahepatic biliary ductal dilatation. Cholecystectomy. No retained calcified stone noted in the central CBD. Pancreas: Unremarkable. No pancreatic ductal dilatation or surrounding inflammatory changes. Spleen: Normal in size without focal abnormality. Adrenals/Urinary Tract: The adrenal glands unremarkable. Mild fullness of the renal collecting system and pelvis similar to prior CT. There is bilateral extrarenal pelvis with mild pelviectasis. There is symmetric enhancement and excretion of contrast by both kidneys. The visualized ureters and urinary bladder appear unremarkable. Stomach/Bowel: There is thickening of the distal and terminal ileum. There is mild dilatation of the small bowel loops proximal to the terminal ileum measuring up to 3.1 cm in caliber. A transition is noted in the right lower abdomen (60/2 and 66/4). Findings consistent with a degree of small-bowel obstruction, possibly secondary to inflammatory changes of the distal/terminal ileum with associated wall thickening and luminal narrowing or secondary to adhesions. This appearance is similar to the prior CT. The colon is collapsed. The appendix is normal. Vascular/Lymphatic: The abdominal aorta and IVC unremarkable. No portal venous gas. There is no adenopathy. Reproductive: Prostatectomy. Other: Small fat containing umbilical hernia. Musculoskeletal: No acute osseous pathology. There is heterotopic  calcification adjacent to the left ischial tuberosity. IMPRESSION: 1. Small-bowel obstruction with transition in the right lower abdomen similar to prior CT. 2. Fatty liver. Electronically Signed   By: Anner Crete M.D.   On: 09/01/2019 17:42   DG Abd Portable  1V-Small Bowel Obstruction Protocol-24 hr delay  Result Date: 09/03/2019 CLINICAL DATA:  Small bowel obstruction EXAM: PORTABLE ABDOMEN - 1 VIEW COMPARISON:  Yesterday FINDINGS: No gas dilated bowel is seen. Oral contrast outlines the nondilated colon. The remaining abdomen is relatively gasless. No concerning mass effect or gas collection. IMPRESSION: Oral contrast is seen within nondilated colon. The small bowel is essentially gasless. Electronically Signed   By: Monte Fantasia M.D.   On: 09/03/2019 06:01   DG Abd Portable 1V-Small Bowel Obstruction Protocol-initial, 8 hr delay  Result Date: 09/02/2019 CLINICAL DATA:  Small bowel obstruction. EXAM: PORTABLE ABDOMEN - 1 VIEW COMPARISON:  09/01/2018 FINDINGS: Bowel gas pattern is nonobstructive. Contrast is present throughout the colon. Remainder the exam is unchanged. IMPRESSION: Nonobstructive bowel gas pattern. Electronically Signed   By: Marin Olp M.D.   On: 09/02/2019 21:14   DG Abd Portable 1V  Result Date: 09/01/2019 CLINICAL DATA:  Status post nasogastric tube placement. EXAM: PORTABLE ABDOMEN - 1 VIEW COMPARISON:  05/03/2019 FINDINGS: Nasogastric tube tip overlies the level of the mid stomach. Contrast is identified within the renal collecting systems following CT exam earlier today. Dilated bowel loops are identified within the abdomen. No evidence for free intraperitoneal air. IMPRESSION: Nasogastric tube tip overlying the level of the mid stomach. Electronically Signed   By: Nolon Nations M.D.   On: 09/01/2019 19:33   Scheduled Meds:  fluticasone  2 spray Each Nare Daily   guaiFENesin  1,200 mg Oral BID   heparin injection (subcutaneous)  5,000 Units Subcutaneous Q8H    pantoprazole (PROTONIX) IV  40 mg Intravenous Q24H   Continuous Infusions:  dextrose 5 % and 0.9 % NaCl with KCl 40 mEq/L 75 mL/hr at 09/03/19 0745    LOS: 2 days   Kerney Elbe, DO [facility] PAGER is on [place]  If 7PM-7AM, please contact night-coverage [email or web address]

## 2022-01-27 IMAGING — DX DG ABD PORTABLE 1V
1 series · 1 of 1 positions shown · non-contrast
Comparison: 09/01/2018

CLINICAL DATA: Small bowel obstruction.

EXAM:
PORTABLE ABDOMEN - 1 VIEW

[abdomen kub]
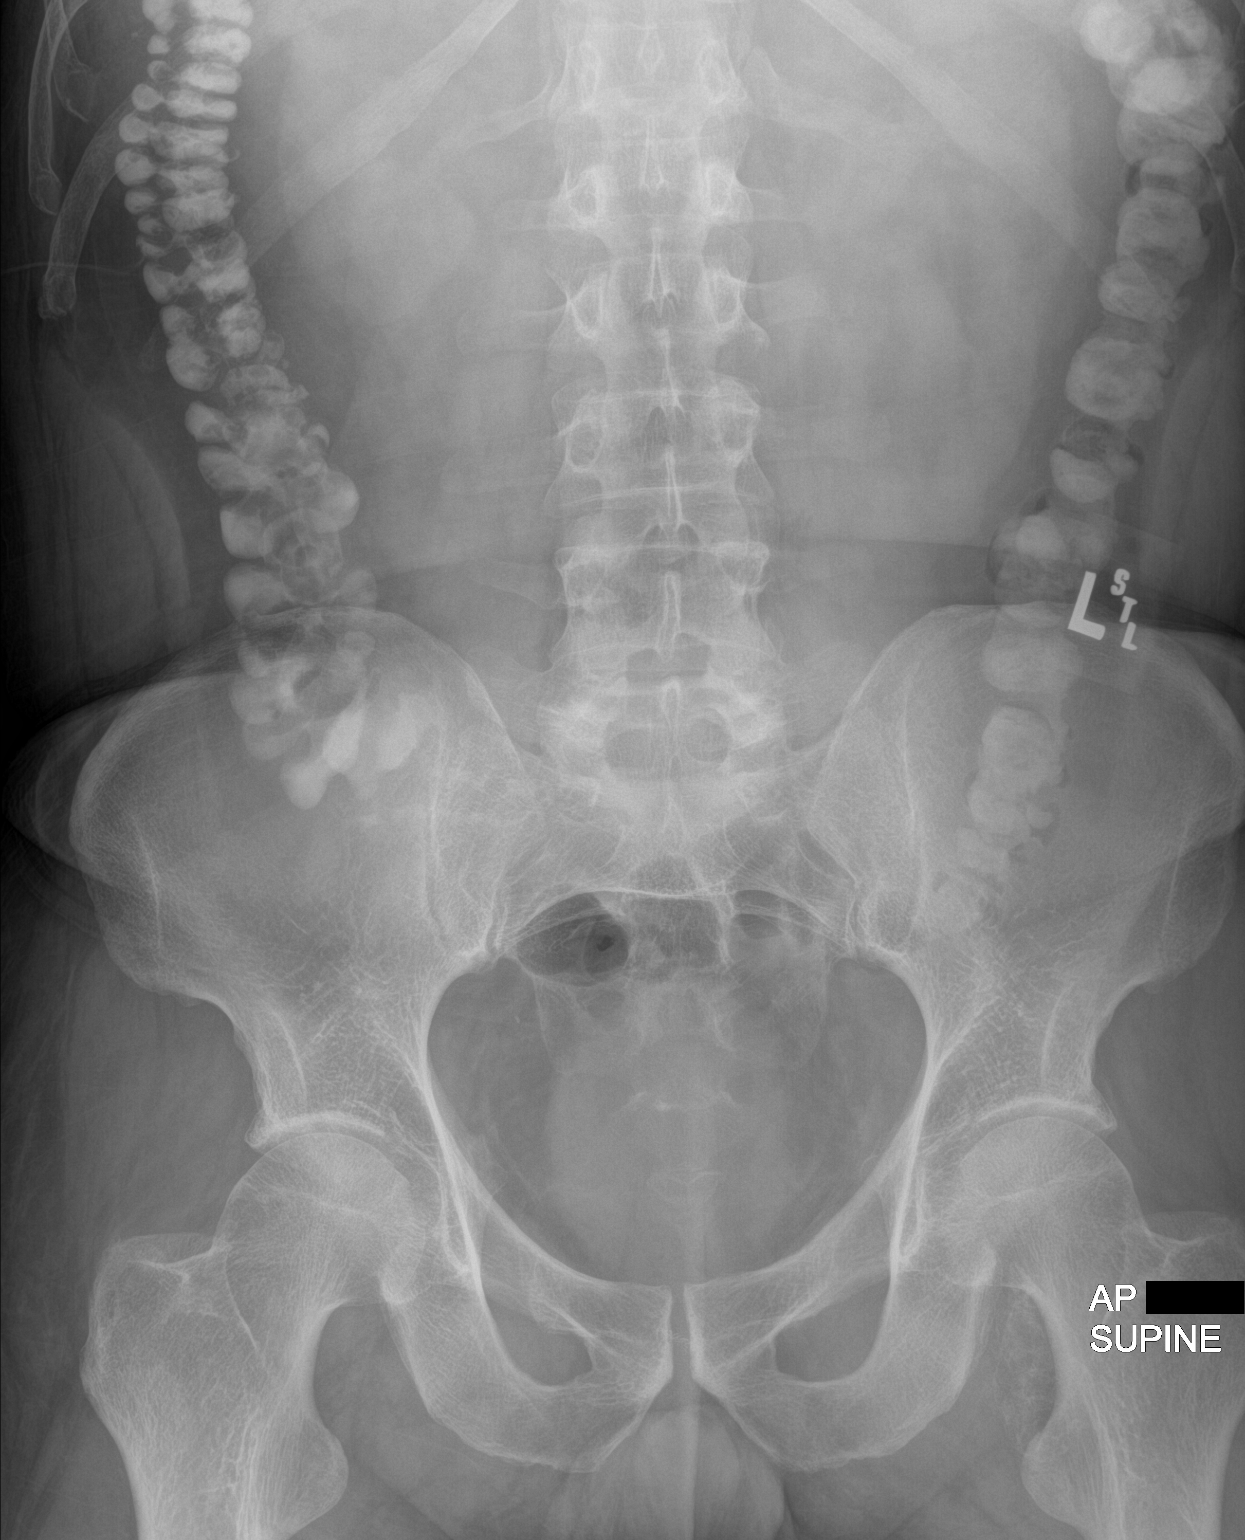

[1 of 1 positions shown; findings below may reference images not displayed]

FINDINGS: Bowel gas pattern is nonobstructive. Contrast is present throughout
the colon. Remainder the exam is unchanged.
IMPRESSION: Nonobstructive bowel gas pattern.

## 2022-02-09 ENCOUNTER — Other Ambulatory Visit: Payer: Self-pay

## 2022-02-09 ENCOUNTER — Emergency Department (HOSPITAL_BASED_OUTPATIENT_CLINIC_OR_DEPARTMENT_OTHER): Payer: 59

## 2022-02-09 ENCOUNTER — Encounter (HOSPITAL_BASED_OUTPATIENT_CLINIC_OR_DEPARTMENT_OTHER): Payer: Self-pay | Admitting: Pediatrics

## 2022-02-09 ENCOUNTER — Emergency Department (HOSPITAL_BASED_OUTPATIENT_CLINIC_OR_DEPARTMENT_OTHER)
Admission: EM | Admit: 2022-02-09 | Discharge: 2022-02-09 | Disposition: A | Discharge status: Home / Self Care | Payer: 59 | Attending: Emergency Medicine | Admitting: Emergency Medicine

## 2022-02-09 DIAGNOSIS — K5732 Diverticulitis of large intestine without perforation or abscess without bleeding: Secondary | ICD-10-CM | POA: Diagnosis not present

## 2022-02-09 DIAGNOSIS — N132 Hydronephrosis with renal and ureteral calculous obstruction: Secondary | ICD-10-CM | POA: Insufficient documentation

## 2022-02-09 DIAGNOSIS — N200 Calculus of kidney: Secondary | ICD-10-CM

## 2022-02-09 DIAGNOSIS — Z8546 Personal history of malignant neoplasm of prostate: Secondary | ICD-10-CM | POA: Diagnosis not present

## 2022-02-09 DIAGNOSIS — R109 Unspecified abdominal pain: Secondary | ICD-10-CM | POA: Diagnosis present

## 2022-02-09 LAB — URINALYSIS, ROUTINE W REFLEX MICROSCOPIC
Bilirubin Urine: NEGATIVE
Glucose, UA: NEGATIVE mg/dL
Ketones, ur: NEGATIVE mg/dL
Leukocytes,Ua: NEGATIVE
Nitrite: NEGATIVE
Protein, ur: NEGATIVE mg/dL
Specific Gravity, Urine: 1.025 (ref 1.005–1.030)
pH: 5 (ref 5.0–8.0)

## 2022-02-09 LAB — COMPREHENSIVE METABOLIC PANEL
ALT: 39 U/L (ref 0–44)
AST: 38 U/L (ref 15–41)
Albumin: 4.6 g/dL (ref 3.5–5.0)
Alkaline Phosphatase: 84 U/L (ref 38–126)
Anion gap: 10 (ref 5–15)
BUN: 15 mg/dL (ref 8–23)
CO2: 20 mmol/L — ABNORMAL LOW (ref 22–32)
Calcium: 9.6 mg/dL (ref 8.9–10.3)
Chloride: 110 mmol/L (ref 98–111)
Creatinine, Ser: 1.31 mg/dL — ABNORMAL HIGH (ref 0.61–1.24)
GFR, Estimated: >60 mL/min (ref >60)
Glucose, Bld: 85 mg/dL (ref 70–99)
Potassium: 3.7 mmol/L (ref 3.5–5.1)
Sodium: 140 mmol/L (ref 135–145)
Total Bilirubin: 1.4 mg/dL — ABNORMAL HIGH (ref 0.3–1.2)
Total Protein: 7.9 g/dL (ref 6.5–8.1)

## 2022-02-09 LAB — CBC WITH DIFFERENTIAL/PLATELET
Abs Immature Granulocytes: 0.02 10*3/uL (ref 0.00–0.07)
Basophils Absolute: 0 10*3/uL (ref 0.0–0.1)
Basophils Relative: 0 %
Eosinophils Absolute: 0 10*3/uL (ref 0.0–0.5)
Eosinophils Relative: 1 %
HCT: 48.9 % (ref 39.0–52.0)
Hemoglobin: 16.7 g/dL (ref 13.0–17.0)
Immature Granulocytes: 0 %
Lymphocytes Relative: 11 %
Lymphs Abs: 0.8 10*3/uL (ref 0.7–4.0)
MCH: 31.5 pg (ref 26.0–34.0)
MCHC: 34.2 g/dL (ref 30.0–36.0)
MCV: 92.1 fL (ref 80.0–100.0)
Monocytes Absolute: 0.5 10*3/uL (ref 0.1–1.0)
Monocytes Relative: 7 %
Neutro Abs: 5.7 10*3/uL (ref 1.7–7.7)
Neutrophils Relative %: 81 %
Platelets: 213 10*3/uL (ref 150–400)
RBC: 5.31 MIL/uL (ref 4.22–5.81)
RDW: 12.7 % (ref 11.5–15.5)
WBC: 7 10*3/uL (ref 4.0–10.5)
nRBC: 0 % (ref 0.0–0.2)

## 2022-02-09 LAB — URINALYSIS, MICROSCOPIC (REFLEX)

## 2022-02-09 LAB — LIPASE, BLOOD: Lipase: 37 U/L (ref 11–51)

## 2022-02-09 MED ORDER — AMOXICILLIN-POT CLAVULANATE 875-125 MG PO TABS: 1.0000 | ORAL_TABLET | Freq: Two times a day (BID) | ORAL | 0 refills | Status: DC

## 2022-02-09 MED ORDER — OXYCODONE-ACETAMINOPHEN 5-325 MG PO TABS: 1.0000 | ORAL_TABLET | Freq: Four times a day (QID) | ORAL | 0 refills | Status: DC | PRN

## 2022-02-09 MED ORDER — KETOROLAC TROMETHAMINE 30 MG/ML IJ SOLN
30.0000 mg | Freq: Once | INTRAMUSCULAR | Status: AC
  Administered 2022-02-09: 30 mg via INTRAVENOUS
  Filled 2022-02-09: qty 1

## 2022-02-09 MED ORDER — TAMSULOSIN HCL 0.4 MG PO CAPS: 0.4000 mg | ORAL_CAPSULE | Freq: Every day | ORAL | 0 refills | Status: DC

## 2022-02-09 NOTE — ED Notes (Signed)
Pt aware of need for urine specimen, unable to provide at this time,

## 2022-02-09 NOTE — Discharge Instructions (Addendum)
You are seen in the emergency department for some right-sided flank and abdominal pain.  You have a 5 mm kidney stone that is likely the source of your pain.  You also had some signs of diverticulitis on your CAT scan.  We are treating you with antibiotics pain medication and medication to help the stone pass.  Please stay well-hydrated.  Avoid any constipation.  Follow-up with urology and your primary care doctor.  Return to the emergency department for any high fevers or uncontrolled pain.

## 2022-02-09 NOTE — ED Provider Notes (Signed)
[facility] EMERGENCY DEPARTMENT Provider Note   CSN: [id number] Arrival date & time: 02/09/22  4401     History  Chief Complaint  Patient presents with   Back Pain    Johnny Burns is a 61 y.o. male.  He has a history of prostate cancer.  Complaining of right flank pain that started a few days ago as a twinge.  Recurred again today radiating into right lower quadrant pain.  Rates it as moderate.  He states he has had a kidney stone before and this feels similar.  No fevers chills nausea vomiting no urinary symptoms hematuria.  No numbness or weakness.  No trauma.  The history is provided by the patient.  Flank Pain This is a recurrent problem. The current episode started 3 to 5 hours ago. The problem occurs constantly. The problem has been gradually improving. Associated symptoms include abdominal pain. Pertinent negatives include no chest pain, no headaches and no shortness of breath. Nothing aggravates the symptoms. Nothing relieves the symptoms. He has tried nothing for the symptoms. The treatment provided mild relief.       Home Medications Prior to Admission medications   Medication Sig Start Date End Date Taking? Authorizing Provider  Ascorbic Acid (VITAMIN C) 1000 MG tablet Take 1,000 mg by mouth daily. Patient not taking: Reported on 09/01/2019    [provider]  atorvastatin (LIPITOR) 10 MG tablet Take 10 mg by mouth every evening.    [provider]  cetirizine (ZYRTEC) 10 MG tablet Take 10 mg by mouth at bedtime.     [provider]  chlorhexidine (PERIDEX) 0.12 % solution Use as directed 5 mLs in the mouth or throat 2 (two) times daily.  08/26/19   [provider]  fenofibrate 160 MG tablet Take 160 mg by mouth every evening.    [provider]  fluticasone (FLONASE) 50 MCG/ACT nasal spray Place 2 sprays into both nostrils daily. 09/03/19   Sheikh, Omair Latif, DO  guaiFENesin (MUCINEX) 600 MG 12 hr tablet Take 1  tablet (600 mg total) by mouth 2 (two) times daily. 09/03/19   Raiford Noble Latif, DO  Multiple Vitamins-Minerals (ZINC PO) Take 1 tablet by mouth daily. Patient not taking: Reported on 09/01/2019    [provider]  ondansetron (ZOFRAN) 8 MG tablet Take 8 mg by mouth every 8 (eight) hours as needed for nausea or vomiting.    [provider]  pantoprazole (PROTONIX) 40 MG tablet Take 40 mg by mouth at bedtime. Patient not taking: Reported on 09/01/2019    [provider]  polyvinyl alcohol (LIQUIFILM TEARS) 1.4 % ophthalmic solution Place 1 drop into both eyes as needed for dry eyes. Patient not taking: Reported on 09/01/2019    [provider]  potassium chloride SA (KLOR-CON) 20 MEQ tablet Take 1 tablet (20 mEq total) by mouth daily for 5 days. 05/05/19 05/10/19  Kayleen Memos, DO      Allergies    Patient has no known allergies.    Review of Systems   Review of Systems  Constitutional:  Negative for fever.  Respiratory:  Negative for shortness of breath.   Cardiovascular:  Negative for chest pain.  Gastrointestinal:  Positive for abdominal pain. Negative for nausea and vomiting.  Genitourinary:  Positive for flank pain. Negative for dysuria and hematuria.  Musculoskeletal:  Positive for back pain.  Skin:  Negative for rash.  Neurological:  Negative for weakness, numbness and headaches.    Physical  Exam Updated Vital Signs BP (!) 116/94 (BP Location: Left Arm)   Pulse 67   Temp 97.7 F (36.5 C) (Oral)   Resp 18   Ht '5\' 9"'$  (1.753 m)   Wt 81.6 kg   SpO2 100%   BMI 26.58 kg/m  Physical Exam Vitals and nursing note reviewed.  Constitutional:      General: He is not in acute distress.    Appearance: Normal appearance. He is well-developed.  HENT:     Head: Normocephalic and atraumatic.  Eyes:     Conjunctiva/sclera: Conjunctivae normal.  Cardiovascular:     Rate and Rhythm: Normal rate and regular rhythm.     Heart sounds: No murmur  heard. Pulmonary:     Effort: Pulmonary effort is normal. No respiratory distress.     Breath sounds: Normal breath sounds.  Abdominal:     Palpations: Abdomen is soft.     Tenderness: There is no abdominal tenderness. There is no guarding or rebound.  Musculoskeletal:        General: No tenderness.     Cervical back: Neck supple.     Comments: He has no midline spine tenderness.  There is no overlying skin changes  Skin:    General: Skin is warm and dry.     Capillary Refill: Capillary refill takes less than 2 seconds.  Neurological:     General: No focal deficit present.     Mental Status: He is alert.     Sensory: No sensory deficit.     Motor: No weakness.     Gait: Gait normal.     ED Results / Procedures / Treatments   Labs (all labs ordered are listed, but only abnormal results are displayed) Labs Reviewed  URINALYSIS, ROUTINE W REFLEX MICROSCOPIC - Abnormal; Notable for the following components:      Result Value   Hgb urine dipstick MODERATE (*)    All other components within normal limits  COMPREHENSIVE METABOLIC PANEL - Abnormal; Notable for the following components:   CO2 20 (*)    Creatinine, Ser 1.31 (*)    Total Bilirubin 1.4 (*)    All other components within normal limits  URINALYSIS, MICROSCOPIC (REFLEX) - Abnormal; Notable for the following components:   Bacteria, UA RARE (*)    All other components within normal limits  CBC WITH DIFFERENTIAL/PLATELET  LIPASE, BLOOD    EKG None  Radiology CT Renal Stone Study  Result Date: 02/09/2022 CLINICAL DATA:  Intermittent right flank pain since Sunday. EXAM: CT ABDOMEN AND PELVIS WITHOUT CONTRAST TECHNIQUE: Multidetector CT imaging of the abdomen and pelvis was performed following the standard protocol without IV contrast. RADIATION DOSE REDUCTION: This exam was performed according to the departmental dose-optimization program which includes automated exposure control, adjustment of the mA and/or kV  according to patient size and/or use of iterative reconstruction technique. COMPARISON:  CT abdomen pelvis dated September 01, 2019. FINDINGS: Lower chest: No acute abnormality. Unchanged minimal linear scarring in the left lower lobe. Hepatobiliary: Unchanged lobulated liver contour without focal abnormality. Status post cholecystectomy. No biliary dilatation. Pancreas: Unremarkable. No pancreatic ductal dilatation or surrounding inflammatory changes. Spleen: Normal in size without focal abnormality. Adrenals/Urinary Tract: Adrenal glands are unremarkable. New 5 mm calculus in the proximal right ureter with resultant mild right hydronephrosis. Two new punctate calculi in the upper pole of the left kidney. The bladder is unremarkable for the degree of distention. Stomach/Bowel: Mild fat stranding adjacent to an ascending colon diverticulum (series 2,  image 50). No significant bowel wall thickening. No obstruction. The stomach is within normal limits. Normal appendix. Vascular/Lymphatic: Aortic atherosclerosis. No enlarged abdominal or pelvic lymph nodes. Reproductive: Prior prostatectomy. Other: Unchanged tiny fat containing umbilical hernia. No free fluid or pneumoperitoneum. Musculoskeletal: No acute or significant osseous findings. IMPRESSION: 1. New 5 mm calculus in the proximal right ureter with resultant mild right hydronephrosis. 2. New punctate nonobstructive left nephrolithiasis. 3. Very mild acute diverticulitis of the ascending colon. 4.  Aortic Atherosclerosis (ICD10-I70.0). Electronically Signed   By: Titus Dubin M.D.   On: 02/09/2022 11:20    Procedures Procedures    Medications Ordered in ED Medications  ketorolac (TORADOL) 30 MG/ML injection 30 mg (has no administration in time range)    ED Course/ Medical Decision Making/ A&P                           Medical Decision Making Amount and/or Complexity of Data Reviewed Labs: ordered. Radiology: ordered.  Risk Prescription drug  management.   This patient complains of right flank right lower quad abdominal pain; this involves an extensive number of treatment Options and is a complaint that carries with it a high risk of complications and morbidity. The differential includes renal colic, pyelonephritis, appendicitis, diverticulitis, colitis  I ordered, reviewed and interpreted labs, which included CBC with normal white count normal hemoglobin, chemistries normal other than low bicarb slightly elevated creatinine, urinalysis without signs of infection I ordered medication IV Toradol and reviewed PMP when indicated. I ordered imaging studies which included CT renal and I independently    visualized and interpreted imaging which showed 5 mm proximal stone on right, also with some early signs of diverticulitis Additional history obtained from patient's wife Previous records obtained and reviewed in epic no recent admissions Cardiac monitoring reviewed, normal sinus rhythm Social determinants considered, no significant barriers Critical Interventions: None  After the interventions stated above, I reevaluated the patient and found patient's pain to be adequately controlled. Admission and further testing considered, reviewed results of workup with him.  No indications for admission or further workup at this time.  Will cover with antibiotics pain medicine and Flomax.  He already has urologist to follow-up with.  Return instructions discussed         Final Clinical Impression(s) / ED Diagnoses Final diagnoses:  Kidney stone on right side  Diverticulitis large intestine w/o perforation or abscess w/o bleeding    Rx / DC Orders ED Discharge Orders          Ordered    tamsulosin (FLOMAX) 0.4 MG CAPS capsule  Daily        02/09/22 1337    oxyCODONE-acetaminophen (PERCOCET/ROXICET) 5-325 MG tablet  Every 6 hours PRN        02/09/22 1337    amoxicillin-clavulanate (AUGMENTIN) 875-125 MG tablet  Every 12 hours         02/09/22 1337              Hayden Rasmussen, MD 02/09/22 1815

## 2022-02-09 NOTE — ED Triage Notes (Signed)
C/O right sided back pain with some radiation to the front; started Sunday with some aches ; pain comes and go in nature. Denies painful urination,

## 2022-03-31 ENCOUNTER — Emergency Department (HOSPITAL_BASED_OUTPATIENT_CLINIC_OR_DEPARTMENT_OTHER): Payer: 59

## 2022-03-31 ENCOUNTER — Other Ambulatory Visit: Payer: Self-pay

## 2022-03-31 ENCOUNTER — Emergency Department (HOSPITAL_BASED_OUTPATIENT_CLINIC_OR_DEPARTMENT_OTHER)
Admission: EM | Admit: 2022-03-31 | Discharge: 2022-04-01 | Disposition: A | Discharge status: Home / Self Care | Payer: 59 | Attending: Emergency Medicine | Admitting: Emergency Medicine

## 2022-03-31 DIAGNOSIS — N132 Hydronephrosis with renal and ureteral calculous obstruction: Secondary | ICD-10-CM | POA: Insufficient documentation

## 2022-03-31 DIAGNOSIS — R109 Unspecified abdominal pain: Secondary | ICD-10-CM | POA: Diagnosis present

## 2022-03-31 DIAGNOSIS — Z8546 Personal history of malignant neoplasm of prostate: Secondary | ICD-10-CM | POA: Diagnosis not present

## 2022-03-31 LAB — CBC WITH DIFFERENTIAL/PLATELET
Abs Immature Granulocytes: 0.03 10*3/uL (ref 0.00–0.07)
Basophils Absolute: 0.1 10*3/uL (ref 0.0–0.1)
Basophils Relative: 1 %
Eosinophils Absolute: 0.2 10*3/uL (ref 0.0–0.5)
Eosinophils Relative: 1 %
HCT: 40.9 % (ref 39.0–52.0)
Hemoglobin: 14.1 g/dL (ref 13.0–17.0)
Immature Granulocytes: 0 %
Lymphocytes Relative: 7 %
Lymphs Abs: 0.9 10*3/uL (ref 0.7–4.0)
MCH: 31.7 pg (ref 26.0–34.0)
MCHC: 34.5 g/dL (ref 30.0–36.0)
MCV: 91.9 fL (ref 80.0–100.0)
Monocytes Absolute: 1 10*3/uL (ref 0.1–1.0)
Monocytes Relative: 7 %
Neutro Abs: 11 10*3/uL — ABNORMAL HIGH (ref 1.7–7.7)
Neutrophils Relative %: 84 %
Platelets: 218 10*3/uL (ref 150–400)
RBC: 4.45 MIL/uL (ref 4.22–5.81)
RDW: 12.6 % (ref 11.5–15.5)
WBC: 13.1 10*3/uL — ABNORMAL HIGH (ref 4.0–10.5)
nRBC: 0 % (ref 0.0–0.2)

## 2022-03-31 MED ORDER — SODIUM CHLORIDE 0.9 % IV BOLUS
1000.0000 mL | Freq: Once | INTRAVENOUS | Status: AC
  Administered 2022-04-01: 1000 mL via INTRAVENOUS

## 2022-03-31 MED ORDER — OXYCODONE-ACETAMINOPHEN 5-325 MG PO TABS
1.0000 | ORAL_TABLET | ORAL | Status: DC | PRN
  Administered 2022-03-31: 1 via ORAL
  Filled 2022-03-31: qty 1

## 2022-03-31 MED ORDER — ONDANSETRON HCL 4 MG/2ML IJ SOLN
4.0000 mg | Freq: Once | INTRAMUSCULAR | Status: DC
  Filled 2022-03-31: qty 2

## 2022-03-31 MED ORDER — KETOROLAC TROMETHAMINE 30 MG/ML IJ SOLN
30.0000 mg | Freq: Once | INTRAMUSCULAR | Status: AC
  Administered 2022-03-31: 30 mg via INTRAVENOUS
  Filled 2022-03-31: qty 1

## 2022-03-31 NOTE — ED Triage Notes (Signed)
Pt with right flank pain.  Known kidney stone x 2 months.  Has been to the urologist and has not been able to pass the stone yet.  Pt has tried prescribed medications which were working but today got a lot worse and is not responding to pain medication.

## 2022-03-31 NOTE — ED Provider Notes (Signed)
[facility] EMERGENCY DEPART[facility]  Provider Note  CSN: [id number] Arrival date & time: 03/31/22 2034  History Chief Complaint  Patient presents with   Flank Pain    Johnny Burns is a 62 y.o. male with history of renal stones was in the ED for flank pain on 12/13 and found to have a moderate sized proximal R ureteral stone as well as mild ascending diverticulitis. He was given pain medications, Abx and flomax, followed up with Urology but has not yet passed the stone. He has had intermittent flank pain but now more frequent and radiating into suprapubic area. No dysuria.    Home Medications Prior to Admission medications   Medication Sig Start Date End Date Taking? Authorizing Provider  amoxicillin-clavulanate (AUGMENTIN) 875-125 MG tablet Take 1 tablet by mouth every 12 (twelve) hours. 02/09/22   Hayden Rasmussen, MD  Ascorbic Acid (VITAMIN C) 1000 MG tablet Take 1,000 mg by mouth daily. Patient not taking: Reported on 09/01/2019    [provider]  atorvastatin (LIPITOR) 10 MG tablet Take 10 mg by mouth every evening.    [provider]  cetirizine (ZYRTEC) 10 MG tablet Take 10 mg by mouth at bedtime.     [provider]  chlorhexidine (PERIDEX) 0.12 % solution Use as directed 5 mLs in the mouth or throat 2 (two) times daily.  08/26/19   [provider]  fenofibrate 160 MG tablet Take 160 mg by mouth every evening.    [provider]  fluticasone (FLONASE) 50 MCG/ACT nasal spray Place 2 sprays into both nostrils daily. 09/03/19   Sheikh, Omair Latif, DO  guaiFENesin (MUCINEX) 600 MG 12 hr tablet Take 1 tablet (600 mg total) by mouth 2 (two) times daily. 09/03/19   Raiford Noble Latif, DO  Multiple Vitamins-Minerals (ZINC PO) Take 1 tablet by mouth daily. Patient not taking: Reported on 09/01/2019    [provider]  ondansetron (ZOFRAN) 8 MG tablet Take 8 mg by mouth every 8 (eight) hours as needed for nausea or  vomiting.    [provider]  oxyCODONE-acetaminophen (PERCOCET/ROXICET) 5-325 MG tablet Take 1 tablet by mouth every 6 (six) hours as needed for severe pain. 02/09/22   Hayden Rasmussen, MD  pantoprazole (PROTONIX) 40 MG tablet Take 40 mg by mouth at bedtime. Patient not taking: Reported on 09/01/2019    [provider]  polyvinyl alcohol (LIQUIFILM TEARS) 1.4 % ophthalmic solution Place 1 drop into both eyes as needed for dry eyes. Patient not taking: Reported on 09/01/2019    [provider]  potassium chloride SA (KLOR-CON) 20 MEQ tablet Take 1 tablet (20 mEq total) by mouth daily for 5 days. 05/05/19 05/10/19  Kayleen Memos, DO  tamsulosin (FLOMAX) 0.4 MG CAPS capsule Take 1 capsule (0.4 mg total) by mouth daily. 02/09/22   Hayden Rasmussen, MD     Allergies    Patient has no known allergies.   Review of Systems   Review of Systems Please see HPI for pertinent positives and negatives  Physical Exam BP 130/79 (BP Location: Right Arm)   Pulse 68   Temp 98.3 F (36.8 C) (Oral)   Resp 16   SpO2 99%   Physical Exam Vitals and nursing note reviewed.  HENT:     Head: Normocephalic.     Nose: Nose normal.  Eyes:     Extraocular Movements: Extraocular movements intact.  Pulmonary:     Effort: Pulmonary effort is  normal.  Musculoskeletal:        General: Normal range of motion.     Cervical back: Neck supple.  Skin:    Findings: No rash (on exposed skin).  Neurological:     Mental Status: He is alert and oriented to person, place, and time.  Psychiatric:        Mood and Affect: Mood normal.     ED Results / Procedures / Treatments   EKG None  Procedures Procedures  Medications Ordered in the ED Medications  oxyCODONE-acetaminophen (PERCOCET/ROXICET) 5-325 MG per tablet 1 tablet (1 tablet Oral Given 03/31/22 2100)  ondansetron (ZOFRAN) injection 4 mg (0 mg Intravenous Hold 04/01/22 0000)  sodium chloride 0.9 % bolus 1,000 mL ( Intravenous  Stopped 04/01/22 0103)  ketorolac (TORADOL) 30 MG/ML injection 30 mg (30 mg Intravenous Given 03/31/22 2359)    Initial Impression and Plan  Patient here with persistent flank pain/renal colic, has a known stone for about 6 weeks which he has not passed yet. I personally viewed the images from radiology studies and agree with radiologist interpretation: CT shows stone has migrated to distal ureter, just above UVJ. Patient and wife at bedside aware of sclerotic lesion on T11, has history of prostate cancer, s/p resection with recently normal PSA. Will check labs, including UA for signs of infection. Toradol for continued pain after percocet given in triage.   ED Course   Clinical Course as of 04/01/22 0117  Thu Mar 31, 2022  2354 CBC with mild leukocytosis.  [CS]  Fri Apr 01, 2022  0040 CMP shows CKD about at prior baseline. UA is neg for infection.  [CS]  0116 Patient's pain is improved. Feeling well and ready to go home. He has plenty of pain medications at home. Recommend he avoid long term NSAID use due to his renal function, follow up with Urology, RTED for any other concerns. [CS]    Clinical Course User Index [CS] Truddie Hidden, MD     MDM Rules/Calculators/A&P Medical Decision Making Problems Addressed: Ureteral stone with hydronephrosis: chronic illness or injury with exacerbation, progression, or side effects of treatment  Amount and/or Complexity of Data Reviewed Labs: ordered. Decision-making details documented in ED Course. Radiology: ordered and independent interpretation performed. Decision-making details documented in ED Course.  Risk Prescription drug management.     Final Clinical Impression(s) / ED Diagnoses Final diagnoses:  Ureteral stone with hydronephrosis    Rx / DC Orders ED Discharge Orders     None        Truddie Hidden, MD 04/01/22 [phone]

## 2022-04-01 LAB — COMPREHENSIVE METABOLIC PANEL
ALT: 34 U/L (ref 0–44)
AST: 33 U/L (ref 15–41)
Albumin: 3.6 g/dL (ref 3.5–5.0)
Alkaline Phosphatase: 78 U/L (ref 38–126)
Anion gap: 3 — ABNORMAL LOW (ref 5–15)
BUN: 15 mg/dL (ref 8–23)
CO2: 25 mmol/L (ref 22–32)
Calcium: 8.9 mg/dL (ref 8.9–10.3)
Chloride: 106 mmol/L (ref 98–111)
Creatinine, Ser: 1.55 mg/dL — ABNORMAL HIGH (ref 0.61–1.24)
GFR, Estimated: 50 mL/min — ABNORMAL LOW (ref >60)
Glucose, Bld: 114 mg/dL — ABNORMAL HIGH (ref 70–99)
Potassium: 3.4 mmol/L — ABNORMAL LOW (ref 3.5–5.1)
Sodium: 134 mmol/L — ABNORMAL LOW (ref 135–145)
Total Bilirubin: 0.7 mg/dL (ref 0.3–1.2)
Total Protein: 6.3 g/dL — ABNORMAL LOW (ref 6.5–8.1)

## 2022-04-01 LAB — URINALYSIS, MICROSCOPIC (REFLEX)

## 2022-04-01 LAB — URINALYSIS, ROUTINE W REFLEX MICROSCOPIC
Bilirubin Urine: NEGATIVE
Glucose, UA: NEGATIVE mg/dL
Ketones, ur: NEGATIVE mg/dL
Leukocytes,Ua: NEGATIVE
Nitrite: NEGATIVE
Protein, ur: NEGATIVE mg/dL
Specific Gravity, Urine: >=1.03 (ref 1.005–1.030)
pH: 5 (ref 5.0–8.0)

## 2022-04-06 ENCOUNTER — Other Ambulatory Visit: Payer: Self-pay | Admitting: Urology

## 2022-04-08 NOTE — Patient Instructions (Signed)
SURGICAL WAITING ROOM VISITATION  Patients having surgery or a procedure may have no more than 2 support people in the waiting area - these visitors may rotate.    Children under the age of 62 must have an adult with them who is not the patient.  Due to an increase in RSV and influenza rates and associated hospitalizations, children ages 18 and under may not visit patients in [place].  If the patient needs to stay at the hospital during part of their recovery, the visitor guidelines for inpatient rooms apply. Pre-op nurse will coordinate an appropriate time for 1 support person to accompany patient in pre-op.  This support person may not rotate.    Please refer to the [facility] website for the visitor guidelines for Inpatients (after your surgery is over and you are in a regular room).    Your procedure is scheduled on: 04/15/22   Report to [facility] Main Entrance    Report to admitting at 12:45 PM   Call this number if you have problems the morning of surgery [phone]   Do not eat food or drink liquids :After Midnight.          If you have questions, please contact your surgeon's office.   FOLLOW BOWEL PREP AND ANY ADDITIONAL PRE OP INSTRUCTIONS YOU RECEIVED FROM YOUR SURGEON'S OFFICE!!!     Oral Hygiene is also important to reduce your risk of infection.                                    Remember - BRUSH YOUR TEETH THE MORNING OF SURGERY WITH YOUR REGULAR TOOTHPASTE  DENTURES WILL BE REMOVED PRIOR TO SURGERY PLEASE DO NOT APPLY "Poly grip" OR ADHESIVES!!!   Take these medicines the morning of surgery with A SIP OF WATER: Percocet, Pantoprazole, Tamsulosin                               You may not have any metal on your body including jewelry, and body piercing             Do not wear lotions, powders, cologne, or deodorant  Do not shave  48 hours prior to surgery.               Men may shave face and neck.   Do not bring valuables to the  hospital. [place].   Contacts, glasses, dentures or bridgework may not be worn into surgery.  DO NOT [place]. PHARMACY WILL DISPENSE MEDICATIONS LISTED ON YOUR MEDICATION LIST TO YOU DURING YOUR ADMISSION [place]!    Patients discharged on the day of surgery will not be allowed to drive home.  Someone NEEDS to stay with you for the first 24 hours after anesthesia.              Please read over the following fact sheets you were given: IF Johnny Burns [phone] Burns    If you received a COVID test during your pre-op visit  it is requested that you wear a mask when out in public, stay away from anyone that may not be feeling well and notify your surgeon  if you develop symptoms. If you test positive for Covid or have been in contact with anyone that has tested positive in the last 10 days please notify you surgeon.    Johnny Burns - Preparing for Surgery Before surgery, you can play an important role.  Because skin is not sterile, your skin needs to be as free of germs as possible.  You can reduce the number of germs on your skin by washing with CHG (chlorahexidine gluconate) soap before surgery.  CHG is an antiseptic cleaner which kills germs and bonds with the skin to continue killing germs even after washing. Please DO NOT use if you have an allergy to CHG or antibacterial soaps.  If your skin becomes reddened/irritated stop using the CHG and inform your nurse when you arrive at Short Stay. Do not shave (including legs and underarms) for at least 48 hours prior to the first CHG shower.  You may shave your face/neck.  Please follow these instructions carefully:  1.  Shower with CHG Soap the night before surgery and the  morning of surgery.  2.  If you choose to wash your hair, wash your hair first as usual with your normal  shampoo.  3.  After you  shampoo, rinse your hair and body thoroughly to remove the shampoo.                             4.  Use CHG as you would any other liquid soap.  You can apply chg directly to the skin and wash.  Gently with a scrungie or clean washcloth.  5.  Apply the CHG Soap to your body ONLY FROM THE NECK DOWN.   Do   not use on face/ open                           Wound or open sores. Avoid contact with eyes, ears mouth and   genitals (private parts).                       Wash face,  Genitals (private parts) with your normal soap.             6.  Wash thoroughly, paying special attention to the area where your    surgery  will be performed.  7.  Thoroughly rinse your body with warm water from the neck down.  8.  DO NOT shower/wash with your normal soap after using and rinsing off the CHG Soap.                9.  Pat yourself dry with a clean towel.            10.  Wear clean pajamas.            11.  Place clean sheets on your bed the night of your first shower and do not  sleep with pets. Day of Surgery : Do not apply any lotions/deodorants the morning of surgery.  Please wear clean clothes to the hospital/surgery center.  FAILURE TO FOLLOW THESE INSTRUCTIONS MAY RESULT IN THE CANCELLATION OF YOUR SURGERY  PATIENT SIGNATURE_________________________________  NURSE SIGNATURE__________________________________  ________________________________________________________________________

## 2022-04-08 NOTE — Progress Notes (Signed)
COVID Vaccine Completed:  Date of COVID positive in last 90 days:  PCP - Darlyne Russian Cardiologist -   Chest x-ray -  EKG -  Stress Test -  ECHO -  Cardiac Cath -  Pacemaker/ICD device last checked: Spinal Cord Stimulator:  Bowel Prep -   Sleep Study -  CPAP -   Fasting Blood Sugar -  Checks Blood Sugar _____ times a day  Last dose of GLP1 agonist-  N/A GLP1 instructions:  N/A   Last dose of SGLT-2 inhibitors-  N/A SGLT-2 instructions: N/A   Blood Thinner Instructions: Aspirin Instructions: Last Dose:  Activity level:  Can go up a flight of stairs and perform activities of daily living without stopping and without symptoms of chest pain or shortness of breath.  Able to exercise without symptoms  Unable to go up a flight of stairs without symptoms of     Anesthesia review:   Patient denies shortness of breath, fever, cough and chest pain at PAT appointment  Patient verbalized understanding of instructions that were given to them at the PAT appointment. Patient was also instructed that they will need to review over the PAT instructions again at home before surgery.

## 2022-04-11 ENCOUNTER — Encounter (HOSPITAL_COMMUNITY)
Admission: RE | Admit: 2022-04-11 | Discharge: 2022-04-11 | Disposition: A | Discharge status: Home / Self Care | Payer: 59 | Source: Ambulatory Visit | Attending: Urology | Admitting: Urology

## 2022-04-11 ENCOUNTER — Encounter (HOSPITAL_COMMUNITY): Payer: Self-pay

## 2022-04-11 VITALS — BP 138/92 | HR 70 | Temp 98.1°F | Resp 16 | Ht 69.0 in | Wt 179.0 lb

## 2022-04-11 DIAGNOSIS — Z01812 Encounter for preprocedural laboratory examination: Secondary | ICD-10-CM | POA: Insufficient documentation

## 2022-04-11 DIAGNOSIS — Z01818 Encounter for other preprocedural examination: Secondary | ICD-10-CM

## 2022-04-11 LAB — CBC
HCT: 42.9 % (ref 39.0–52.0)
Hemoglobin: 14.4 g/dL (ref 13.0–17.0)
MCH: 31.2 pg (ref 26.0–34.0)
MCHC: 33.6 g/dL (ref 30.0–36.0)
MCV: 93.1 fL (ref 80.0–100.0)
Platelets: 259 10*3/uL (ref 150–400)
RBC: 4.61 MIL/uL (ref 4.22–5.81)
RDW: 12.7 % (ref 11.5–15.5)
WBC: 5.3 10*3/uL (ref 4.0–10.5)
nRBC: 0 % (ref 0.0–0.2)

## 2022-04-15 ENCOUNTER — Encounter (HOSPITAL_COMMUNITY)
Admission: RE | Disposition: A | Discharge status: Home / Self Care | Payer: Self-pay | Source: Home / Self Care | Attending: Urology

## 2022-04-15 ENCOUNTER — Ambulatory Visit (HOSPITAL_COMMUNITY): Payer: 59

## 2022-04-15 ENCOUNTER — Ambulatory Visit (HOSPITAL_BASED_OUTPATIENT_CLINIC_OR_DEPARTMENT_OTHER): Payer: 59 | Admitting: Anesthesiology

## 2022-04-15 ENCOUNTER — Ambulatory Visit (HOSPITAL_COMMUNITY)
Admission: RE | Admit: 2022-04-15 | Discharge: 2022-04-15 | Disposition: A | Discharge status: Home / Self Care | Payer: 59 | Attending: Urology | Admitting: Urology

## 2022-04-15 ENCOUNTER — Ambulatory Visit (HOSPITAL_COMMUNITY): Payer: 59 | Admitting: Anesthesiology

## 2022-04-15 ENCOUNTER — Encounter (HOSPITAL_COMMUNITY): Payer: Self-pay | Admitting: Urology

## 2022-04-15 DIAGNOSIS — Z9049 Acquired absence of other specified parts of digestive tract: Secondary | ICD-10-CM | POA: Diagnosis not present

## 2022-04-15 DIAGNOSIS — N132 Hydronephrosis with renal and ureteral calculous obstruction: Secondary | ICD-10-CM | POA: Diagnosis not present

## 2022-04-15 DIAGNOSIS — K219 Gastro-esophageal reflux disease without esophagitis: Secondary | ICD-10-CM | POA: Insufficient documentation

## 2022-04-15 DIAGNOSIS — Z808 Family history of malignant neoplasm of other organs or systems: Secondary | ICD-10-CM | POA: Diagnosis not present

## 2022-04-15 DIAGNOSIS — C61 Malignant neoplasm of prostate: Secondary | ICD-10-CM | POA: Insufficient documentation

## 2022-04-15 DIAGNOSIS — N202 Calculus of kidney with calculus of ureter: Secondary | ICD-10-CM | POA: Diagnosis not present

## 2022-04-15 HISTORY — PX: HOLMIUM LASER APPLICATION: SHX5852

## 2022-04-15 HISTORY — PX: CYSTOSCOPY WITH RETROGRADE PYELOGRAM, URETEROSCOPY AND STENT PLACEMENT: SHX5789

## 2022-04-15 SURGERY — CYSTOURETEROSCOPY, WITH RETROGRADE PYELOGRAM AND STENT INSERTION
Anesthesia: General | Site: Ureter | Laterality: Bilateral

## 2022-04-15 MED ORDER — IOHEXOL 300 MG/ML  SOLN
INTRAMUSCULAR | Status: DC | PRN
  Administered 2022-04-15: 50 mL

## 2022-04-15 MED ORDER — GENTAMICIN SULFATE 40 MG/ML IJ SOLN
400.0000 mg | INTRAVENOUS | Status: AC
  Administered 2022-04-15: 400 mg via INTRAVENOUS
  Filled 2022-04-15: qty 10

## 2022-04-15 MED ORDER — SODIUM CHLORIDE 0.9 % IR SOLN
Status: DC | PRN
  Administered 2022-04-15: 3000 mL

## 2022-04-15 MED ORDER — LIDOCAINE 2% (20 MG/ML) 5 ML SYRINGE
INTRAMUSCULAR | Status: DC | PRN
  Administered 2022-04-15: 100 mg via INTRAVENOUS

## 2022-04-15 MED ORDER — ONDANSETRON HCL 4 MG/2ML IJ SOLN
INTRAMUSCULAR | Status: AC
  Filled 2022-04-15: qty 2

## 2022-04-15 MED ORDER — OXYCODONE-ACETAMINOPHEN 5-325 MG PO TABS: 1.0000 | ORAL_TABLET | Freq: Four times a day (QID) | ORAL | 0 refills | Status: DC | PRN

## 2022-04-15 MED ORDER — KETOROLAC TROMETHAMINE 10 MG PO TABS: 10.0000 mg | ORAL_TABLET | Freq: Three times a day (TID) | ORAL | 0 refills | Status: DC | PRN

## 2022-04-15 MED ORDER — EPHEDRINE 5 MG/ML INJ
INTRAVENOUS | Status: AC
  Filled 2022-04-15: qty 5

## 2022-04-15 MED ORDER — MIDAZOLAM HCL 2 MG/2ML IJ SOLN
INTRAMUSCULAR | Status: AC
  Filled 2022-04-15: qty 2

## 2022-04-15 MED ORDER — SULFAMETHOXAZOLE-TRIMETHOPRIM 800-160 MG PO TABS: 1.0000 | ORAL_TABLET | Freq: Every day | ORAL | 0 refills | Status: DC

## 2022-04-15 MED ORDER — DEXAMETHASONE SODIUM PHOSPHATE 10 MG/ML IJ SOLN
INTRAMUSCULAR | Status: DC | PRN
  Administered 2022-04-15: 10 mg via INTRAVENOUS

## 2022-04-15 MED ORDER — CHLORHEXIDINE GLUCONATE 0.12 % MT SOLN
15.0000 mL | Freq: Once | OROMUCOSAL | Status: AC
  Administered 2022-04-15: 15 mL via OROMUCOSAL

## 2022-04-15 MED ORDER — PROPOFOL 10 MG/ML IV BOLUS
INTRAVENOUS | Status: AC
  Filled 2022-04-15: qty 20

## 2022-04-15 MED ORDER — FENTANYL CITRATE (PF) 100 MCG/2ML IJ SOLN
INTRAMUSCULAR | Status: AC
  Filled 2022-04-15: qty 2

## 2022-04-15 MED ORDER — PROPOFOL 10 MG/ML IV BOLUS
INTRAVENOUS | Status: DC | PRN
  Administered 2022-04-15: 200 mg via INTRAVENOUS

## 2022-04-15 MED ORDER — DEXAMETHASONE SODIUM PHOSPHATE 10 MG/ML IJ SOLN
INTRAMUSCULAR | Status: AC
  Filled 2022-04-15: qty 1

## 2022-04-15 MED ORDER — SENNOSIDES-DOCUSATE SODIUM 8.6-50 MG PO TABS: 1.0000 | ORAL_TABLET | Freq: Two times a day (BID) | ORAL | 0 refills | Status: DC

## 2022-04-15 MED ORDER — KETOROLAC TROMETHAMINE 30 MG/ML IJ SOLN
INTRAMUSCULAR | Status: AC
  Filled 2022-04-15: qty 1

## 2022-04-15 MED ORDER — ONDANSETRON HCL 4 MG/2ML IJ SOLN
INTRAMUSCULAR | Status: DC | PRN
  Administered 2022-04-15: 4 mg via INTRAVENOUS

## 2022-04-15 MED ORDER — LACTATED RINGERS IV SOLN: INTRAVENOUS | Status: DC

## 2022-04-15 MED ORDER — MIDAZOLAM HCL 5 MG/5ML IJ SOLN
INTRAMUSCULAR | Status: DC | PRN
  Administered 2022-04-15: 2 mg via INTRAVENOUS

## 2022-04-15 MED ORDER — LIDOCAINE HCL (PF) 2 % IJ SOLN
INTRAMUSCULAR | Status: AC
  Filled 2022-04-15: qty 5

## 2022-04-15 MED ORDER — FENTANYL CITRATE (PF) 100 MCG/2ML IJ SOLN
INTRAMUSCULAR | Status: DC | PRN
  Administered 2022-04-15: 100 ug via INTRAVENOUS

## 2022-04-15 MED ORDER — ORAL CARE MOUTH RINSE: 15.0000 mL | Freq: Once | OROMUCOSAL | Status: AC

## 2022-04-15 MED ORDER — KETOROLAC TROMETHAMINE 30 MG/ML IJ SOLN
INTRAMUSCULAR | Status: DC | PRN
  Administered 2022-04-15: 30 mg via INTRAVENOUS

## 2022-04-15 MED ORDER — EPHEDRINE SULFATE-NACL 50-0.9 MG/10ML-% IV SOSY
PREFILLED_SYRINGE | INTRAVENOUS | Status: DC | PRN
  Administered 2022-04-15: 10 mg via INTRAVENOUS

## 2022-04-15 SURGICAL SUPPLY — 24 items
BAG URO CATCHER STRL LF (MISCELLANEOUS) ×1 IMPLANT
BASKET LASER NITINOL 1.9FR (BASKET) IMPLANT
CATH URETL OPEN END 6FR 70 (CATHETERS) ×1 IMPLANT
CLOTH BEACON ORANGE TIMEOUT ST (SAFETY) ×1 IMPLANT
EXTRACTOR STONE 1.7FRX115CM (UROLOGICAL SUPPLIES) IMPLANT
GLOVE SURG LX STRL 7.5 STRW (GLOVE) ×1 IMPLANT
GOWN STRL REUS W/ TWL XL LVL3 (GOWN DISPOSABLE) ×1 IMPLANT
GOWN STRL REUS W/TWL XL LVL3 (GOWN DISPOSABLE) ×1
GUIDEWIRE ANG ZIPWIRE 038X150 (WIRE) ×1 IMPLANT
GUIDEWIRE STR DUAL SENSOR (WIRE) ×1 IMPLANT
KIT TURNOVER KIT A (KITS) IMPLANT
LASER FIB FLEXIVA PULSE ID 365 (Laser) IMPLANT
LASER FIB FLEXIVA PULSE ID 550 (Laser) IMPLANT
LASER FIB FLEXIVA PULSE ID 910 (Laser) IMPLANT
MANIFOLD NEPTUNE II (INSTRUMENTS) ×1 IMPLANT
PACK CYSTO (CUSTOM PROCEDURE TRAY) ×1 IMPLANT
SHEATH NAVIGATOR HD 11/13X28 (SHEATH) IMPLANT
SHEATH NAVIGATOR HD 11/13X36 (SHEATH) IMPLANT
STENT POLARIS 5FRX24 (STENTS) IMPLANT
TRACTIP FLEXIVA PULS ID 200XHI (Laser) IMPLANT
TRACTIP FLEXIVA PULSE ID 200 (Laser)
TUBE PU 8FR 16IN ENFIT (TUBING) ×1 IMPLANT
TUBING CONNECTING 10 (TUBING) ×1 IMPLANT
TUBING UROLOGY SET (TUBING) ×1 IMPLANT

## 2022-04-15 NOTE — Anesthesia Postprocedure Evaluation (Signed)
Anesthesia Post Note  Patient: [name]  Procedure([facility]urns) Performed: CYSTOSCOPY WITH RETROGRADE PYELOGRAM, URETEROSCOPY AND STENT PLACEMENT (Bilateral: Ureter) HOLMIUM LASER APPLICATION (Bilateral: Ureter)     Patient location during evaluation: PACU Anesthesia Type: General Level of consciousness: awake and alert Pain management: pain level controlled Vital Signs Assessment: post-procedure vital signs reviewed and stable Respiratory status: spontaneous breathing, nonlabored ventilation, respiratory function stable and patient connected to nasal cannula oxygen Cardiovascular status: blood pressure returned to baseline and stable Postop Assessment: no apparent nausea or vomiting Anesthetic complications: no  No notable events documented.  Last Vitals:  Vitals:   04/15/22 1309 04/15/22 1530  BP: 118/81 112/69  Pulse: 74   Resp: 15 15  Temp: 36.6 C 36.8 C  SpO2: 99% 100%    Last Pain:  Vitals:   04/15/22 1321  TempSrc:   PainSc: 0-No pain                 Johnny Burns

## 2022-04-15 NOTE — Op Note (Unsigned)
NAME: Johnny Burns, Johnny Burns MEDICAL RECORD NO: [id number] ACCOUNT NO: [id number] DATE OF BIRTH: January 15, 1961 FACILITY: [facility] LOCATION: [facility] PHYSICIAN: Alexis Frock, MD  Operative Report   DATE OF PROCEDURE: 04/15/2022  PREOPERATIVE DIAGNOSIS:  Right ureteral and left renal stones.  PROCEDURE PERFORMED: 1.  Cystoscopy with bilateral retrograde pyelograms interpretation. 2.  Bilateral ureteroscopy with laser lithotripsy. 3.  Insertion of bilateral ureteral stents.  ESTIMATED BLOOD LOSS:  Nil.  COMPLICATIONS:  None.  SPECIMEN:  Right ureteral stone fragments for analysis.  FINDINGS: 1.  Moderate right hydronephrosis with tortuosity. 2.  Right distal third ureteral stone. 3.  Left papillary tip calcifications. 4.  Complete resolution of all accessible stone fragments larger than one-third mm following laser lithotripsy and basket extraction. 5.  Successful placement of bilateral ureteral stents, proximal end in the renal pelvis, distal in urinary bladder, with tether.  INDICATIONS:  The patient is a very pleasant 62 year old man with history of prostate cancer, status post prostatectomy and adjuvant radiation.  He has done well from this with excellent biochemical control for years.  Also, has a history of recurrent  urolithiasis.  He was found on workup to have colicky flank pain.  Most recently to have a right distal third ureteral stone approximately 4 mm.  He also has some left papillary tip calcifications as well.  Options were discussed for management including  continued medical therapy versus shockwave lithotripsy versus ureteroscopy, unilateral versus bilateral and we both agreed on medical therapy with bilateral ureteroscopy in elective setting.  He has failed to pass the stone after approximately another 2  weeks of medical therapy, he presents for ureteroscopy today.  He has not had interval stone passage.  Informed consent was obtained and placed in medical  record.  PROCEDURE IN DETAIL:  The patient being Johnny Burns, verified and procedure being bilateral ureteroscopic stone manipulation was confirmed.  Procedure timeout was performed.  Intravenous antibiotics were administered.  General LMA anesthesia was  induced.  The patient placed into a low lithotomy position.  Sterile field was created, prepped and draped the patient's penis, perineum, and proximal thighs using iodine.  Cystourethroscopy was performed using 21-French rigid cystoscope with offset  lens.  Inspection of anterior and posterior urethra revealed absence of a surgical clip in prostate.  Inspection of the urinary bladder revealed no diverticula, calcifications, papillary lesions.  There was some fixation of the bladder neck area  consistent with prior radiation as anticipated.  The right ureteral orifice was cannulated with a 6-French open-ended catheter, and right retrograde pyelogram was obtained.  Right retrograde pyelogram demonstrated single right ureter, single system right kidney.  There was some mild tortuosity and moderate hydronephrosis proximally and filling defect in distal third of the ureter, likely close to the area of the iliac  vessels consistent with known stone.  A 0.038 ZIPwire was advanced to the level of the upper pole, set aside as a safety wire.  Next, left retrograde pyelogram was obtained.  Left retrograde pyelogram demonstrated single left ureter, single system left kidney.  There was some mild caliectasis noted without hydroureteronephrosis.  A separate ZIPwire was advanced to the level of the upper pole, set aside as a safety wire.  An  8-French feeding tube placed in the urinary bladder for pressure release.  Next, semirigid ureteroscopy was performed to distal two-thirds left ureter alongside a separate sensor working wire.  No mucosal abnormalities were found.  Next, semirigid  ureteroscopy was performed to distal two-thirds of the right ureter  alongside  a separate sensor working wire.  In the distal third of the ureter at approximately the area of the iliac vessels stone in question was encountered.  Some of which did appear  just too large for simple basketing.  As such, holmium laser energy applied to stone using setting of 0.2 joules and 20 Hz fragmented into smaller pieces.  They were then amenable to simple basketing and fragments were removed, set aside for composition  analysis.   Next, a medium length ureteral access sheath was placed in the left sensor working wire to the level of proximal left ureter using continuous fluoroscopic  guidance and flexible digital ureteroscopy was performed of the proximal left ureter and systematic inspection in locating all calices x3. There are multifocal papillary tip calcifications noted, none of which were free floating at this point. Holmium  laser energy then applied to the papillary tip calcifications with setting of 0.2 joules and 20 Hz.  All foci were completely ablated approximately 6 separate ones and the entire left kidney was inspected and all stone fragments larger than one-third mm  has successfully been ablated with technique. Access sheath was removed under continuous vision, no significant mucosal abnormalities found.  Given bilateral nature of the surgery today, it was felt that brief interval stenting with tethered stent would  be most prudent.  As such, a new 5 x 24 Polaris type stents were placed over the safety wires using fluoroscopic guidance.  Good proximal and distal planes were noted.  Tether was left in place, trimmed to length, tied together and taped to the dorsum of  penis and the procedure was terminated.  The patient tolerated procedure well, no immediate periprocedural complications.  The patient was taken to the postanesthesia care unit in stable condition.  Plan for discharge home.   SHY D: 04/15/2022 3:31:33 pm T: 04/15/2022 7:44:00 pm  JOB: [id number]

## 2022-04-15 NOTE — H&P (Signed)
Johnny Burns is an 62 y.o. male.    Chief Complaint: Pre-Op BILATERAL Ureteroscopic Stone Manipulation  HPI:   1 - Recurrent Urolithiasis -  2014 - SWL for left 95m ureteral stone, req f/u URS for retained fragments.  03/2022 - Rt 474mdisrtal ureteral stone, punctate left renal on ER CT. Cr 1.5, UA without infectious parameters.    2 - High Risk Prostate Cancer - s/p robotic prostatecotmy with ICG sentinal + templay pelvic lymphadenectomy 11/2016 for [id number] Gleason 4+5=9 adenocarcinoma with FOCAL POSITIVE SV margin. All prostate margins negative. Pre-op PSA 5.2 and CT and Bone Scan 09/2016 clinically localized.   Summarized Post-op Course:  02/2017 PSA - 0.015; 05/2017 PSA 0.027 ==:> Adjuvant Radiation under care Johnny Burns 05/2020 PSA .092; 10/2020 .072  04/2021 PSA 0.12; 10/2021 PSA 0.13   PMH sig for onchomycosis, right inguinal hernia repair, lap chole. 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 His PCP is Johnny Burns with Johnny Burns   Today Johnny Burns seen  to proceed with BILATERAL uretroscopy for Rt ureteral, Lt renal stones. No interval fevers. Most recent UA without infectious parameters.   Past Medical History:  Diagnosis Date   Chronic kidney disease    Stage 3 sees primary physcian   GERD (gastroesophageal reflux disease)    History of kidney stones    Hyperlipidemia    Left ureteral calculus    [facility]    Past Surgical History:  Procedure Laterality Date   CHOLECYSTECTOMY N/A 04/04/2019   Procedure: LAPAROSCOPIC CHOLECYSTECTOMY;  Surgeon: Kinsinger, LuArta BruceMD;  [facility];  Laterality: N/A;   CYSTOSCOPY WITH RETROGRADE PYELOGRAM, URETEROSCOPY AND STENT PLACEMENT Left 11/30/2012   Procedure: CYSTOSCOPY WITH LEFT RETROGRADE PYELOGRAM, LEFT URETEROSCOPY LEFT STONE EXTRACTION WITH BASKET  AND LEFT STENT PLACEMENT;   Surgeon: Johnny Burns;  [facility] Service: Urology;  Laterality: Left;   EXTRACORPOREAL SHOCK WAVE LITHOTRIPSY Left 10-08-2012   INGUINAL HERNIA REPAIR Right 1990   LYMPHADENECTOMY Bilateral 11/30/2016   Procedure: PELVIC LYMPHADENECTOMY;  Surgeon: Johnny Burns;  Location: [facility];  Service: Urology;  Laterality: Bilateral;   ROBOT ASSISTED LAPAROSCOPIC RADICAL PROSTATECTOMY N/A 11/30/2016   Procedure: XI ROBOTIC ASSISTED LAPAROSCOPIC RADICAL PROSTATECTOMY;  Surgeon: Johnny Burns;  Location: [facility];  Service: Urology;  Laterality: N/A;   WISDOM TOOTH EXTRACTION     XI ROBOTIC ASSISTED SIMPLE PROSTATECTOMY     Dr. MaTresa Burns    Family History  Problem Relation Age of Onset   Cancer Father        throat   Hypertension Brother    Social History:  reports that he has never smoked. He has never used smokeless tobacco. He reports that he does not drink alcohol and does not use drugs.  Allergies: No Known Allergies  No medications prior to admission.    No results found for this or any previous visit (from the past 48 hour(s)). No results found.  Review of Systems  Constitutional:  Negative for chills and fever.  Genitourinary:  Positive for flank pain.  All other systems reviewed and are negative.   There were no vitals taken for this visit. Physical Exam Vitals reviewed.  Eyes:     Pupils: Pupils are equal, round, and reactive to light.  Pulmonary:     Effort: Pulmonary effort is normal.  Abdominal:  General: Abdomen is flat.  Genitourinary:    Comments: Minimal CVAT at present Musculoskeletal:        General: Normal range of motion.     Cervical back: Normal range of motion.  Neurological:     Mental Status: He is alert.  Psychiatric:        Mood and Affect: Mood normal.      Assessment/Plan  Proceed with BILATERAL ureteroscopic stone manipulation. Risks, benefits, alternatives, expected peri-op course discussed  previously and reiterated today.   Johnny Frock, MD 04/15/2022, 7:22 AM

## 2022-04-15 NOTE — Discharge Instructions (Addendum)
1 - You may have urinary urgency (bladder spasms) and bloody urine on / off with stent in place. This is normal.  2 - Remove tethered stents on Monday morning at home by pulling on strings, then blue-white plastic tubing, and discarding. There are two stents tied together. Office is open Monday if any issues arise.   3 - Call MD or go to ER for fever >102, severe pain / nausea / vomiting not relieved by medications, or acute change in medical status

## 2022-04-15 NOTE — Anesthesia Procedure Notes (Signed)
Procedure Name: LMA Insertion Date/Time: 04/15/2022 2:40 PM  Performed by: Lind Covert, CRNAPre-anesthesia Checklist: Patient identified, Emergency Drugs available, Suction available, Patient being monitored and Timeout performed Patient Re-evaluated:Patient Re-evaluated prior to induction Oxygen Delivery Method: Circle system utilized Preoxygenation: Pre-oxygenation with 100% oxygen Induction Type: IV induction LMA: LMA inserted LMA Size: 4.0 Tube type: Oral Number of attempts: 1 Placement Confirmation: positive ETCO2 and breath sounds checked- equal and bilateral Tube secured with: Tape Dental Injury: Teeth and Oropharynx as per pre-operative assessment

## 2022-04-15 NOTE — Anesthesia Postprocedure Evaluation (Signed)
Anesthesia Post Note  Patient: Johnny Burns  Procedure(s) Performed: CYSTOSCOPY WITH RETROGRADE PYELOGRAM, URETEROSCOPY AND STENT PLACEMENT (Bilateral: Ureter) HOLMIUM LASER APPLICATION (Bilateral: Ureter)     Patient location during evaluation: PACU Anesthesia Type: General Level of consciousness: awake and alert Pain management: pain level controlled Vital Signs Assessment: post-procedure vital signs reviewed and stable Respiratory status: spontaneous breathing, nonlabored ventilation, respiratory function stable and patient connected to nasal cannula oxygen Cardiovascular status: blood pressure returned to baseline and stable Postop Assessment: no apparent nausea or vomiting Anesthetic complications: no  No notable events documented.  Last Vitals:  Vitals:   04/15/22 1309 04/15/22 1530  BP: 118/81 112/69  Pulse: 74   Resp: 15 15  Temp: 36.6 C 36.8 C  SpO2: 99% 100%    Last Pain:  Vitals:   04/15/22 1321  TempSrc:   PainSc: 0-No pain                 Yaire Kreher S

## 2022-04-15 NOTE — Transfer of Care (Signed)
Immediate Anesthesia Transfer of Care Note  Patient: Johnny Burns  Procedure(s) Performed: CYSTOSCOPY WITH RETROGRADE PYELOGRAM, URETEROSCOPY AND STENT PLACEMENT (Bilateral: Ureter) HOLMIUM LASER APPLICATION (Bilateral: Ureter)  Patient Location: PACU  Anesthesia Type:General  Level of Consciousness: sedated  Airway & Oxygen Therapy: Patient Spontanous Breathing and Patient connected to face mask oxygen  Post-op Assessment: Report given to RN and Post -op Vital signs reviewed and stable  Post vital signs: Reviewed and stable  Last Vitals:  Vitals Value Taken Time  BP    Temp    Pulse    Resp    SpO2      Last Pain:  Vitals:   04/15/22 1321  TempSrc:   PainSc: 0-No pain         Complications: No notable events documented.

## 2022-04-15 NOTE — Anesthesia Preprocedure Evaluation (Signed)
Anesthesia Evaluation  Patient identified by MRN, date of birth, ID band Patient awake    Reviewed: Allergy & Precautions, H&P , NPO status , Patient's Chart, lab work & pertinent test results  Airway Mallampati: II  TM Distance: >3 FB Neck ROM: Full    Dental no notable dental hx.    Pulmonary neg pulmonary ROS   Pulmonary exam normal breath sounds clear to auscultation       Cardiovascular negative cardio ROS Normal cardiovascular exam Rhythm:Regular Rate:Normal     Neuro/Psych negative neurological ROS  negative psych ROS   GI/Hepatic Neg liver ROS,GERD  Medicated,,  Endo/Other  negative endocrine ROS    Renal/GU negative Renal ROS  negative genitourinary   Musculoskeletal negative musculoskeletal ROS (+)    Abdominal   Peds negative pediatric ROS (+)  Hematology negative hematology ROS (+)   Anesthesia Other Findings   Reproductive/Obstetrics negative OB ROS                             Anesthesia Physical Anesthesia Plan  ASA: 2  Anesthesia Plan: General   Post-op Pain Management: Minimal or no pain anticipated   Induction: Intravenous  PONV Risk Score and Plan: 2 and Ondansetron, Dexamethasone and Treatment may vary due to age or medical condition  Airway Management Planned: LMA  Additional Equipment:   Intra-op Plan:   Post-operative Plan: Extubation in OR  Informed Consent: I have reviewed the patients History and Physical, chart, labs and discussed the procedure including the risks, benefits and alternatives for the proposed anesthesia with the patient or authorized representative who has indicated his/her understanding and acceptance.     Dental advisory given  Plan Discussed with: CRNA and Surgeon  Anesthesia Plan Comments:        Anesthesia Quick Evaluation

## 2022-04-15 NOTE — Brief Op Note (Signed)
04/15/2022  3:25 PM  PATIENT:  Gwenyth Bouillon  62 y.o. male  PRE-OPERATIVE DIAGNOSIS:  RIGHT URETERAL AND RENAL STONES  POST-OPERATIVE DIAGNOSIS:  RIGHT URETERAL AND RENAL STONES  PROCEDURE:  Procedure(s): CYSTOSCOPY WITH RETROGRADE PYELOGRAM, URETEROSCOPY AND STENT PLACEMENT (Bilateral) HOLMIUM LASER APPLICATION (Bilateral)  SURGEON:  Surgeon(s) and Role:    Alexis Frock, MD - Primary  PHYSICIAN ASSISTANT:   ASSISTANTS: none   ANESTHESIA:   general  EBL:  minimal   BLOOD ADMINISTERED:none  DRAINS: none   LOCAL MEDICATIONS USED:  NONE  SPECIMEN:  Source of Specimen:  Rt ureteral stone  DISPOSITION OF SPECIMEN:   [facility] for compositional analysis  COUNTS:  YES  TOURNIQUET:  * No tourniquets in log *  DICTATION: .Other Dictation: Dictation Number [id number]  PLAN OF CARE: Discharge to home after PACU  PATIENT DISPOSITION:  PACU - hemodynamically stable.   Delay start of Pharmacological VTE agent (>24hrs) due to surgical blood loss or risk of bleeding: yes

## 2022-04-16 ENCOUNTER — Encounter (HOSPITAL_COMMUNITY): Payer: Self-pay | Admitting: Urology

## 2023-01-18 ENCOUNTER — Other Ambulatory Visit (HOSPITAL_COMMUNITY): Payer: Self-pay | Admitting: Urology

## 2023-01-18 DIAGNOSIS — R9721 Rising PSA following treatment for malignant neoplasm of prostate: Secondary | ICD-10-CM

## 2023-02-01 ENCOUNTER — Ambulatory Visit (HOSPITAL_COMMUNITY)
Admission: RE | Admit: 2023-02-01 | Discharge: 2023-02-01 | Disposition: A | Discharge status: Home / Self Care | Payer: 59 | Source: Ambulatory Visit | Attending: Urology | Admitting: Urology

## 2023-02-01 DIAGNOSIS — R9721 Rising PSA following treatment for malignant neoplasm of prostate: Secondary | ICD-10-CM | POA: Insufficient documentation

## 2023-02-01 MED ORDER — FLOTUFOLASTAT F 18 GALLIUM 296-5846 MBQ/ML IV SOLN
8.4600 | Freq: Once | INTRAVENOUS | Status: AC
  Administered 2023-02-01: 8.46 via INTRAVENOUS

## 2023-03-15 ENCOUNTER — Encounter: Payer: Self-pay | Admitting: Radiation Oncology

## 2023-03-15 NOTE — Progress Notes (Signed)
GU Location of Tumor / Histology: Prostate Ca with bone vertebral mets isolated T11  Prostatectomy (2018) Grade 5, Gleason 4+5=9 adenocarcinoma with FOCAL POSITIVE SV margin.  PSA 0.55 on 11/01/2022 PSA 0.25 on 05/02/2022 PSA 0.13 on 11/22/2021  02/01/2023 Dr. Sebastian Ache NM PET (PSMA) Skull to id Thigh CLINICAL DATA:  Rising PSA after therapy for prostate cancer. PSA equal 0.55.  IMPRESSION: 1. Intense radiotracer activity within the T11 vertebral body consistent with oligometastatic skeletal metastasis. 2. No evidence of local prostate cancer recurrence in the prostate bed. 3. No evidence of nodal metastasis in the pelvis or periaortic retroperitoneum. 4. No evidence of visceral metastasis. 5. LEFT nephrolithiasis.    Past/Anticipated interventions by urology, if any: NA  Past/Anticipated interventions by medical oncology, if any: NA  Weight changes, if any: No  Bowel/Bladder complaints, if any:  No  Nausea/Vomiting, if any: No  Pain issues, if any:  0/10  If Spine Met(s), symptoms, if any, include: Numbness or weakness in extremities (please describe): No Current Decadron regimen, if applicable: No Ambulatory status? Walker? Wheelchair?: No  SAFETY ISSUES: Prior radiation? Yes, prostate adjuvant radiation (2019). Pacemaker/ICD? No Possible current pregnancy? Male Is the patient on methotrexate? No  Current Complaints / other details:

## 2023-03-19 DIAGNOSIS — C61 Malignant neoplasm of prostate: Secondary | ICD-10-CM | POA: Insufficient documentation

## 2023-03-20 ENCOUNTER — Ambulatory Visit
Admission: RE | Admit: 2023-03-20 | Discharge: 2023-03-20 | Disposition: A | Discharge status: Home / Self Care | Payer: 59 | Source: Ambulatory Visit | Attending: Radiation Oncology | Admitting: Radiation Oncology

## 2023-03-20 ENCOUNTER — Encounter: Payer: Self-pay | Admitting: Radiation Oncology

## 2023-03-20 VITALS — BP 123/85 | HR 65 | Temp 97.6°F | Resp 18 | Ht 69.0 in | Wt 193.2 lb

## 2023-03-20 DIAGNOSIS — Z923 Personal history of irradiation: Secondary | ICD-10-CM | POA: Insufficient documentation

## 2023-03-20 DIAGNOSIS — Z79899 Other long term (current) drug therapy: Secondary | ICD-10-CM | POA: Diagnosis not present

## 2023-03-20 DIAGNOSIS — K219 Gastro-esophageal reflux disease without esophagitis: Secondary | ICD-10-CM | POA: Diagnosis not present

## 2023-03-20 DIAGNOSIS — N189 Chronic kidney disease, unspecified: Secondary | ICD-10-CM | POA: Diagnosis not present

## 2023-03-20 DIAGNOSIS — C61 Malignant neoplasm of prostate: Secondary | ICD-10-CM | POA: Diagnosis not present

## 2023-03-20 DIAGNOSIS — C7951 Secondary malignant neoplasm of bone: Secondary | ICD-10-CM | POA: Diagnosis present

## 2023-03-20 DIAGNOSIS — Z87442 Personal history of urinary calculi: Secondary | ICD-10-CM | POA: Insufficient documentation

## 2023-03-20 DIAGNOSIS — E785 Hyperlipidemia, unspecified: Secondary | ICD-10-CM | POA: Insufficient documentation

## 2023-03-20 DIAGNOSIS — Z801 Family history of malignant neoplasm of trachea, bronchus and lung: Secondary | ICD-10-CM | POA: Insufficient documentation

## 2023-03-20 HISTORY — DX: Male erectile dysfunction, unspecified: N52.9

## 2023-03-20 HISTORY — DX: Stress incontinence (female) (male): N39.3

## 2023-03-20 NOTE — Progress Notes (Signed)
Radiation Oncology         [phone] ________________________________  Initial Outpatient Consultation  Name: Johnny Burns MRN: [id number]  Date: 03/20/2023  DOB: 1960-12-11  WU:[id number], Tawni Pummel, MD  Loletta Parish., *   REFERRING PHYSICIAN: Loletta Parish., *  DIAGNOSIS: 63 y.o. gentleman with a solitary osseous metastasis at T11 from stage pT3bN0 Gleason 4+5 prostate cancer, s/p RALP 11/2016 and adjuvant EBRT completed 07/2017    ICD-10-CM   1. Malignant neoplasm of prostate metastatic to bone (HCC)  C61 Prostate-Specific AG, Serum   C79.51     2. Prostate cancer [facility])  C61       HISTORY OF PRESENT ILLNESS: Johnny Burns is a 63 y.o. male with a diagnosis of metastatic prostate cancer. We previously met the patient in 05/2017 to discuss adjuvant radiation following his RALP for pT3b N0 Gleason 4+5 prostate cancer. In summary, he was initially diagnosed with Gleason 4+5 prostate cancer in 09/2016. He opted to proceed with RALP with BPL on 11/30/16 under the care of of Dr. Berneice Heinrich. Pathology confirmed stage pT3bN0 Gleason 4+5 prostate cancer, with extraprostatic extension and seminal vesicle involvement with focal positive margin at right seminal vesicle. His postoperative PSA was very low detectable at 0.015 but nearly doubled to 0.027 just 3 months later. Accordingly, he was referred to [place] to discuss potential adjuvant radiation therapy options and was subsequently treated with 7.5 weeks of EBRT alone, as he declined to add ADT at that time.  His PSA nadired undetectable and remained undetectable through 05/2019.  Since we last saw the patient in 08/2017 for post-treatment follow up, he has continued to monitor his PSA with Dr. Berneice Heinrich.  The PSA has been slowly rising and reached 0.55 most recently in 10/2022, increased from 0.25 in March 2024. This prompted a restaging PSMA PET scan on 02/01/23 showing intense radiotracer activity within the T11 vertebral body but without  evidence of local prostate cancer recurrence, nodal metastasis, or visceral metastasis.    The patient reviewed the biopsy and imaging results with his urologist and he has kindly been referred today for discussion of potential radiation treatment options.   PREVIOUS RADIATION THERAPY: Yes  07/03/17 to 08/24/17:  1. The prostate bed and pelvic lymph nodes were treated to 45 Gy in 25 fractions of 1.8 Gy 2. The prostate bed only, was boosted to 23.4 Gy in 13 fractions of 1.8 Gy  PAST MEDICAL HISTORY:  Past Medical History:  Diagnosis Date   Chronic kidney disease    Stage 3 sees primary physcian   ED (erectile dysfunction)    GERD (gastroesophageal reflux disease)    History of kidney stones    Hyperlipidemia    Left ureteral calculus    Prostate cancer (HCC)    Stress incontinence       PAST SURGICAL HISTORY: Past Surgical History:  Procedure Laterality Date   CHOLECYSTECTOMY N/A 04/04/2019   Procedure: LAPAROSCOPIC CHOLECYSTECTOMY;  Surgeon: Kinsinger, De Blanch, MD;  Location: [facility];  Service: General;  Laterality: N/A;   CYSTOSCOPY WITH RETROGRADE PYELOGRAM, URETEROSCOPY AND STENT PLACEMENT Left 11/30/2012   Procedure: CYSTOSCOPY WITH LEFT RETROGRADE PYELOGRAM, LEFT URETEROSCOPY LEFT STONE EXTRACTION WITH BASKET  AND LEFT STENT PLACEMENT;  Surgeon: Sebastian Ache, MD;  Location: [facility];  Service: Urology;  Laterality: Left;   CYSTOSCOPY WITH RETROGRADE PYELOGRAM, URETEROSCOPY AND STENT PLACEMENT Bilateral 04/15/2022   Procedure: CYSTOSCOPY WITH RETROGRADE PYELOGRAM, URETEROSCOPY AND STENT PLACEMENT;  Surgeon: Sebastian Ache,  MD;  Location: [facility];  Service: Urology;  Laterality: Bilateral;   EXTRACORPOREAL SHOCK WAVE LITHOTRIPSY Left 10/08/2012   HOLMIUM LASER APPLICATION Bilateral 04/15/2022   Procedure: HOLMIUM LASER APPLICATION;  Surgeon: Sebastian Ache, MD;  Location: [facility];  Service: Urology;  Laterality: Bilateral;   INGUINAL  HERNIA REPAIR Right 1990   LYMPHADENECTOMY Bilateral 11/30/2016   Procedure: PELVIC LYMPHADENECTOMY;  Surgeon: Sebastian Ache, MD;  Location: [facility];  Service: Urology;  Laterality: Bilateral;   PROSTATE BIOPSY     ROBOT ASSISTED LAPAROSCOPIC RADICAL PROSTATECTOMY N/A 11/30/2016   Procedure: XI ROBOTIC ASSISTED LAPAROSCOPIC RADICAL PROSTATECTOMY;  Surgeon: Sebastian Ache, MD;  Location: [facility];  Service: Urology;  Laterality: N/A;   WISDOM TOOTH EXTRACTION     XI ROBOTIC ASSISTED SIMPLE PROSTATECTOMY     Dr. Berneice Heinrich 11/30/16    FAMILY HISTORY:  Family History  Problem Relation Age of Onset   Cancer Father        throat   Hypertension Brother     SOCIAL HISTORY:  Social History   Socioeconomic History   Marital status: Married    Spouse name: Not on file   Number of children: 2   Years of education: Not on file   Highest education level: Not on file  Occupational History    Comment: lawn care  Tobacco Use   Smoking status: Never   Smokeless tobacco: Never  Vaping Use   Vaping status: Never Used  Substance and Sexual Activity   Alcohol use: No   Drug use: No   Sexual activity: Yes  Other Topics Concern   Not on file  Social History Narrative   Married with two children. Two grandchildren and one on the way. Resides in [place]. 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   Social Drivers of Corporate investment banker Strain: Not on file  Food Insecurity: Not on file  Transportation Needs: Not on file  Physical Activity: Not on file  Stress: Not on file  Social Connections: Not on file  Intimate Partner Violence: Not on file    ALLERGIES: Patient has no known allergies.  MEDICATIONS:  Current Outpatient Medications  Medication Sig Dispense Refill   atorvastatin (LIPITOR) 10 MG tablet Take 10 mg by mouth every evening.     fenofibrate 160 MG tablet Take 160 mg by mouth every evening.     Multiple Vitamins-Minerals (ICAPS AREDS 2 PO) Take by mouth daily.      zinc gluconate 50 MG tablet Take 50 mg by mouth daily.     No current facility-administered medications for this encounter.    REVIEW OF SYSTEMS:  On review of systems, the patient reports that he is doing well overall. He denies any chest pain, shortness of breath, cough, fevers, chills, night sweats, unintended weight changes. He denies any bowel disturbances, and denies abdominal pain, nausea or vomiting. He denies any new musculoskeletal or joint aches or pains. A complete review of systems is obtained and is otherwise negative.    PHYSICAL EXAM:  Wt Readings from Last 3 Encounters:  03/20/23 193 lb 3.2 oz (87.6 kg)  04/15/22 179 lb (81.2 kg)  04/11/22 179 lb (81.2 kg)   Temp Readings from Last 3 Encounters:  03/20/23 97.6 F (36.4 C)  04/15/22 97.8 F (36.6 C) (Oral)  04/11/22 98.1 F (36.7 C) (Oral)   BP Readings from Last 3 Encounters:  03/20/23 123/85  04/15/22 126/82  04/11/22 (!) 138/92   Pulse Readings from Last 3 Encounters:  03/20/23 65  04/15/22 80  04/11/22 70   Pain Assessment Pain Score: 0-No pain/10  In general this is a well appearing Caucasian man in no acute distress. He's alert and oriented x4 and appropriate throughout the examination. Cardiopulmonary assessment is negative for acute distress, and he exhibits normal effort.     KPS = 100  100 - Normal; no complaints; no evidence of disease. 90   - Able to carry on normal activity; minor signs or symptoms of disease. 80   - Normal activity with effort; some signs or symptoms of disease. 11   - Cares for self; unable to carry on normal activity or to do active work. 60   - Requires occasional assistance, but is able to care for most of his personal needs. 50   - Requires considerable assistance and frequent medical care. 40   - Disabled; requires special care and assistance. 30   - Severely disabled; hospital admission is indicated although death not imminent. 20   - Very sick; hospital  admission necessary; active supportive treatment necessary. 10   - Moribund; fatal processes progressing rapidly. 0     - Dead  Karnofsky DA, Abelmann WH, Craver LS and Burchenal [facility] [phone]) The use of the nitrogen mustards in the palliative treatment of carcinoma: with particular reference to bronchogenic carcinoma Cancer 1 [phone]  LABORATORY DATA:  Lab Results  Component Value Date   WBC 5.3 04/11/2022   HGB 14.4 04/11/2022   HCT 42.9 04/11/2022   MCV 93.1 04/11/2022   PLT 259 04/11/2022   Lab Results  Component Value Date   NA 134 (L) 03/31/2022   K 3.4 (L) 03/31/2022   CL 106 03/31/2022   CO2 25 03/31/2022   Lab Results  Component Value Date   ALT 34 03/31/2022   AST 33 03/31/2022   ALKPHOS 78 03/31/2022   BILITOT 0.7 03/31/2022     RADIOGRAPHY: No results found.    IMPRESSION/PLAN: 1. 63 y.o. gentleman with a solitary osseous metastasis at T11 from stage pT3bN0 Gleason 4+5 prostate cancer, s/p RALP 11/2016 and adjuvant EBRT completed 07/2017 Today, we talked to the patient and family about the findings and workup thus far. We discussed the natural history of oligometastatic prostate cancer and general treatment, highlighting the role of radiotherapy in the management. We discussed the available radiation techniques, and focused on the details and logistics of delivery. The recommendation is for a 5 fraction course of stereotactic body radiotherapy (SBRT) directed to the solitary osseous metastasis in the T11 vertebral body. We reviewed the anticipated acute and late sequelae associated with radiation in this setting. The patient was encouraged to ask questions that were answered to his stated satisfaction.  At the end of our conversation, the patient is in agreement to proceed with the recommended 5 fraction course of metastasis directed SBRT.  He appears to have a good understanding of his disease and our treatment recommendations which are of curative intent.  He has freely  signed written consent to proceed today in the office and a copy of this document will be placed in his medical record.  He is going to have a repeat PSA today so that we have a current baseline prior to starting radiation and he is scheduled for CT SIM at 9am on Thursday 03/23/23, in anticipation of beginning a 5 fraction course of SBRT to T11 in the near future.  We enjoyed meeting with him and his wife again today and look forward to  continuing to participate in his care.  We personally spent 70 minutes in this encounter including chart review, reviewing radiological studies, meeting face-to-face with the patient, entering orders and completing documentation.    Marguarite Arbour, PA-C    Margaretmary Dys, MD  [facility]  Radiation Oncology Direct Dial: [phone]  Fax: [phone] [email or web address]  Skype  LinkedIn   This document serves as a record of services personally performed by Margaretmary Dys, MD and Marcello Fennel, PA-C. It was created on their behalf by Mickie Bail, a trained medical scribe. The creation of this record is based on the scribe's personal observations and the provider's statements to them. This document has been checked and approved by the attending provider.

## 2023-03-21 ENCOUNTER — Encounter: Payer: Self-pay | Admitting: Urology

## 2023-03-21 LAB — PROSTATE-SPECIFIC AG, SERUM (LABCORP): Prostate Specific Ag, Serum: 1.1 ng/mL (ref 0.0–4.0)

## 2023-03-21 NOTE — Progress Notes (Signed)
Repeat PSA on 03/20/23 was 1.1, increased from 0.55 in 10/2022. Patient informed by Cherlyn Cushing, RN, and will share these results with Dr. Berneice Heinrich so that he has a current baseline PSA as well.  Johnny Burns, MMS, PA-C [place]  [facility] Radiation Oncology Physician Assistant Direct Dial: [phone]  Fax: [phone]

## 2023-03-22 NOTE — Progress Notes (Signed)
  Radiation Oncology         [phone] ________________________________  Name: Johnny Burns MRN: [id number]  Date: 03/23/2023  DOB: 1960-10-19  STEREOTACTIC BODY RADIOTHERAPY SIMULATION AND TREATMENT PLANNING NOTE    ICD-10-CM   1. Malignant neoplasm of prostate metastatic to bone [facility])  C61    C79.51       DIAGNOSIS:   63 y.o. gentleman with a solitary osseous metastasis at T11 from stage pT3bN0 Gleason 4+5 prostate cancer, s/p RALP 11/2016 and adjuvant EBRT completed 07/2017   NARRATIVE:  The patient was brought to the CT Simulation planning suite.  Identity was confirmed.  All relevant records and images related to the planned course of therapy were reviewed.  The patient freely provided informed written consent to proceed with treatment after reviewing the details related to the planned course of therapy. The consent form was witnessed and verified by the simulation staff.  Then, the patient was set-up in a stable reproducible  supine position for radiation therapy.  A BodyFix immobilization pillow was fabricated for reproducible positioning.  Surface markings were placed.  The CT images were loaded into the planning software.  The gross target volumes (GTV) and planning target volumes (PTV) were delinieated, and avoidance structures were contoured.  Treatment planning then occurred.  The radiation prescription was entered and confirmed.  A total of two complex treatment devices were fabricated in the form of the BodyFix immobilization pillow and a neck accuform cushion.  I have requested : 3D Simulation  I have requested a DVH of the following structures: targets and all normal structures near the target including kidneys, spinal cord, bowel and others as noted on the radiation plan to maintain doses in adherence with established limits  SPECIAL TREATMENT PROCEDURE:  The planned course of therapy using radiation constitutes a special treatment procedure. Special care is required in the  management of this patient for the following reasons. High dose per fraction requiring special monitoring for increased toxicities of treatment including daily imaging..  The special nature of the planned course of radiotherapy will require increased physician supervision and oversight to ensure patient's safety with optimal treatment outcomes.    This requires extended time and effort.    PLAN:  The patient will receive 40 Gy in 5 fractions to the solitary osseous metastasis at T11.  ________________________________  Artist Pais. Kathrynn Running, M.D.

## 2023-03-23 ENCOUNTER — Ambulatory Visit
Admission: RE | Admit: 2023-03-23 | Discharge: 2023-03-23 | Disposition: A | Discharge status: Home / Self Care | Payer: 59 | Source: Ambulatory Visit | Attending: Radiation Oncology | Admitting: Radiation Oncology

## 2023-03-23 DIAGNOSIS — C61 Malignant neoplasm of prostate: Secondary | ICD-10-CM | POA: Diagnosis present

## 2023-03-23 DIAGNOSIS — C7951 Secondary malignant neoplasm of bone: Secondary | ICD-10-CM | POA: Diagnosis present

## 2023-03-24 NOTE — Progress Notes (Signed)
RN spoke with patient on phone and review recent PSA results.  Patient verbalized understanding.  He will undergo his radiation treatment starting on 2/4.  No additional needs at this time.

## 2023-03-27 DIAGNOSIS — C61 Malignant neoplasm of prostate: Secondary | ICD-10-CM | POA: Diagnosis not present

## 2023-04-04 ENCOUNTER — Ambulatory Visit
Admission: RE | Admit: 2023-04-04 | Discharge: 2023-04-04 | Disposition: A | Discharge status: Home / Self Care | Payer: 59 | Source: Ambulatory Visit | Attending: Radiation Oncology | Admitting: Radiation Oncology

## 2023-04-04 ENCOUNTER — Other Ambulatory Visit: Payer: Self-pay

## 2023-04-04 DIAGNOSIS — C61 Malignant neoplasm of prostate: Secondary | ICD-10-CM | POA: Insufficient documentation

## 2023-04-04 DIAGNOSIS — C7951 Secondary malignant neoplasm of bone: Secondary | ICD-10-CM | POA: Insufficient documentation

## 2023-04-04 LAB — RAD ONC ARIA SESSION SUMMARY
Course Elapsed Days: 0
Plan Fractions Treated to Date: 1
Plan Prescribed Dose Per Fraction: 8 Gy
Plan Total Fractions Prescribed: 5
Plan Total Prescribed Dose: 40 Gy
Reference Point Dosage Given to Date: 8 Gy
Reference Point Session Dosage Given: 8 Gy
Session Number: 1

## 2023-04-05 ENCOUNTER — Ambulatory Visit: Payer: 59

## 2023-04-06 ENCOUNTER — Ambulatory Visit
Admission: RE | Admit: 2023-04-06 | Discharge: 2023-04-06 | Disposition: A | Discharge status: Home / Self Care | Payer: 59 | Source: Ambulatory Visit | Attending: Radiation Oncology | Admitting: Radiation Oncology

## 2023-04-06 ENCOUNTER — Other Ambulatory Visit: Payer: Self-pay

## 2023-04-06 DIAGNOSIS — C61 Malignant neoplasm of prostate: Secondary | ICD-10-CM | POA: Diagnosis not present

## 2023-04-06 LAB — RAD ONC ARIA SESSION SUMMARY
Course Elapsed Days: 2
Plan Fractions Treated to Date: 2
Plan Prescribed Dose Per Fraction: 8 Gy
Plan Total Fractions Prescribed: 5
Plan Total Prescribed Dose: 40 Gy
Reference Point Dosage Given to Date: 16 Gy
Reference Point Session Dosage Given: 8 Gy
Session Number: 2

## 2023-04-07 ENCOUNTER — Ambulatory Visit: Payer: 59

## 2023-04-10 ENCOUNTER — Ambulatory Visit
Admission: RE | Admit: 2023-04-10 | Discharge: 2023-04-10 | Disposition: A | Discharge status: Home / Self Care | Payer: 59 | Source: Ambulatory Visit | Attending: Radiation Oncology | Admitting: Radiation Oncology

## 2023-04-10 ENCOUNTER — Other Ambulatory Visit: Payer: Self-pay

## 2023-04-10 DIAGNOSIS — C61 Malignant neoplasm of prostate: Secondary | ICD-10-CM | POA: Diagnosis not present

## 2023-04-10 LAB — RAD ONC ARIA SESSION SUMMARY
Course Elapsed Days: 6
Plan Fractions Treated to Date: 3
Plan Prescribed Dose Per Fraction: 8 Gy
Plan Total Fractions Prescribed: 5
Plan Total Prescribed Dose: 40 Gy
Reference Point Dosage Given to Date: 24 Gy
Reference Point Session Dosage Given: 8 Gy
Session Number: 3

## 2023-04-12 ENCOUNTER — Other Ambulatory Visit: Payer: Self-pay

## 2023-04-12 ENCOUNTER — Ambulatory Visit
Admission: RE | Admit: 2023-04-12 | Discharge: 2023-04-12 | Disposition: A | Discharge status: Home / Self Care | Payer: 59 | Source: Ambulatory Visit | Attending: Radiation Oncology

## 2023-04-12 DIAGNOSIS — C61 Malignant neoplasm of prostate: Secondary | ICD-10-CM | POA: Diagnosis not present

## 2023-04-12 LAB — RAD ONC ARIA SESSION SUMMARY
Course Elapsed Days: 8
Plan Fractions Treated to Date: 4
Plan Prescribed Dose Per Fraction: 8 Gy
Plan Total Fractions Prescribed: 5
Plan Total Prescribed Dose: 40 Gy
Reference Point Dosage Given to Date: 32 Gy
Reference Point Session Dosage Given: 8 Gy
Session Number: 4

## 2023-04-14 ENCOUNTER — Ambulatory Visit
Admission: RE | Admit: 2023-04-14 | Discharge: 2023-04-14 | Disposition: A | Discharge status: Home / Self Care | Payer: 59 | Source: Ambulatory Visit | Attending: Radiation Oncology | Admitting: Radiation Oncology

## 2023-04-14 ENCOUNTER — Other Ambulatory Visit: Payer: Self-pay

## 2023-04-14 DIAGNOSIS — C61 Malignant neoplasm of prostate: Secondary | ICD-10-CM

## 2023-04-14 LAB — RAD ONC ARIA SESSION SUMMARY
Course Elapsed Days: 10
Plan Fractions Treated to Date: 5
Plan Prescribed Dose Per Fraction: 8 Gy
Plan Total Fractions Prescribed: 5
Plan Total Prescribed Dose: 40 Gy
Reference Point Dosage Given to Date: 40 Gy
Reference Point Session Dosage Given: 8 Gy
Session Number: 5

## 2023-04-17 NOTE — Radiation Completion Notes (Signed)
  Radiation Oncology         [phone]) [phone] ________________________________  Name: Johnny Burns MRN: [id number]  Date: 04/14/2023  DOB: Apr 09, 1960  Referring Physician: Johny Blamer, M.D. Date of Service: 2023-04-17 Radiation Oncologist: Margaretmary Bayley, M.D. [facility]     RADIATION ONCOLOGY END OF TREATMENT NOTE     Diagnosis: 63 y.o. gentleman with a solitary osseous metastasis at T11 from stage pT3bN0 Gleason 4+5 prostate cancer, s/p RALP 11/2016 and adjuvant EBRT completed 07/2017   Intent: Palliative     ==========DELIVERED PLANS==========  First Treatment Date: 2023-04-04 Last Treatment Date: 2023-04-14   Plan Name: Spine_T_SBRT Site: Thoracic Spine Technique: SBRT/SRT-IMRT Mode: Photon Dose Per Fraction: 8 Gy Prescribed Dose (Delivered / Prescribed): 40 Gy / 40 Gy Prescribed Fxs (Delivered / Prescribed): 5 / 5     ==========ON TREATMENT VISIT DATES========== 2023-04-04, 2023-04-06, 2023-04-10, 2023-04-12, 2023-04-14, 2023-04-14   See weekly On Treatment Notes in Epic for details in the Media tab (listed as Progress notes on the On Treatment Visit Dates listed above).  He tolerated the radiation treatments well without any ill side effects and only modest fatigue.  The patient will receive a call in about one month from the radiation oncology department. He will continue follow up with his urologist, Dr. Berneice Heinrich, as well.  ------------------------------------------------   Margaretmary Dys, MD [facility]  Radiation Oncology Direct Dial: [phone]  Fax: [phone] [email or web address]  Skype  LinkedIn

## 2023-04-18 ENCOUNTER — Ambulatory Visit
Admission: RE | Admit: 2023-04-18 | Discharge: 2023-04-18 | Disposition: A | Discharge status: Home / Self Care | Payer: 59 | Source: Ambulatory Visit | Attending: Radiation Oncology | Admitting: Radiation Oncology

## 2023-04-18 NOTE — Progress Notes (Signed)
  Radiation Oncology         [phone]) [phone] ________________________________  Name: Johnny Burns MRN: [id number]  Date of Service: 04/18/2023  DOB: 1960/06/21  Post Treatment Telephone Note  Diagnosis:  C79.51 Secondary malignant neoplasm of bone (as documented in provider EOT note)  The patient was not available for call today. Voicemail left.  The patient is scheduled for ongoing care with Dr. Berneice Heinrich in urology. The patient was encouraged to call if he develops concerns or questions regarding radiation.    Ruel Favors, LPN

## 2023-09-07 DIAGNOSIS — N5231 Erectile dysfunction following radical prostatectomy: Secondary | ICD-10-CM | POA: Diagnosis not present

## 2023-09-07 DIAGNOSIS — C61 Malignant neoplasm of prostate: Secondary | ICD-10-CM | POA: Diagnosis not present

## 2023-09-07 DIAGNOSIS — N202 Calculus of kidney with calculus of ureter: Secondary | ICD-10-CM | POA: Diagnosis not present

## 2023-09-07 DIAGNOSIS — C7951 Secondary malignant neoplasm of bone: Secondary | ICD-10-CM | POA: Diagnosis not present

## 2024-02-02 DIAGNOSIS — E782 Mixed hyperlipidemia: Secondary | ICD-10-CM | POA: Diagnosis not present

## 2024-02-02 DIAGNOSIS — J0100 Acute maxillary sinusitis, unspecified: Secondary | ICD-10-CM | POA: Diagnosis not present

## 2024-02-02 DIAGNOSIS — R0602 Shortness of breath: Secondary | ICD-10-CM | POA: Diagnosis not present

## 2024-02-02 DIAGNOSIS — R5383 Other fatigue: Secondary | ICD-10-CM | POA: Diagnosis not present

## 2024-03-20 ENCOUNTER — Other Ambulatory Visit (HOSPITAL_BASED_OUTPATIENT_CLINIC_OR_DEPARTMENT_OTHER): Payer: Self-pay | Admitting: Family Medicine

## 2024-03-20 DIAGNOSIS — Z136 Encounter for screening for cardiovascular disorders: Secondary | ICD-10-CM

## 2024-03-26 ENCOUNTER — Ambulatory Visit (HOSPITAL_BASED_OUTPATIENT_CLINIC_OR_DEPARTMENT_OTHER)
Admission: RE | Admit: 2024-03-26 | Discharge: 2024-03-26 | Disposition: A | Discharge status: Home / Self Care | Payer: Self-pay | Source: Ambulatory Visit | Attending: Family Medicine | Admitting: Family Medicine

## 2024-03-26 DIAGNOSIS — Z136 Encounter for screening for cardiovascular disorders: Secondary | ICD-10-CM | POA: Insufficient documentation

## 2024-04-02 ENCOUNTER — Inpatient Hospital Stay (HOSPITAL_COMMUNITY)
Admission: EM | Admit: 2024-04-02 | Source: Home / Self Care | Attending: Internal Medicine | Admitting: Internal Medicine

## 2024-04-02 ENCOUNTER — Other Ambulatory Visit: Payer: Self-pay

## 2024-04-02 ENCOUNTER — Emergency Department (HOSPITAL_COMMUNITY)

## 2024-04-02 ENCOUNTER — Encounter (HOSPITAL_COMMUNITY): Payer: Self-pay

## 2024-04-02 DIAGNOSIS — K56609 Unspecified intestinal obstruction, unspecified as to partial versus complete obstruction: Principal | ICD-10-CM | POA: Diagnosis present

## 2024-04-02 LAB — CBC WITH DIFFERENTIAL/PLATELET
Abs Immature Granulocytes: 0.04 10*3/uL (ref 0.00–0.07)
Basophils Absolute: 0 10*3/uL (ref 0.0–0.1)
Basophils Relative: 0 %
Eosinophils Absolute: 0 10*3/uL (ref 0.0–0.5)
Eosinophils Relative: 0 %
HCT: 48 % (ref 39.0–52.0)
Hemoglobin: 15.7 g/dL (ref 13.0–17.0)
Immature Granulocytes: 0 %
Lymphocytes Relative: 13 %
Lymphs Abs: 1.3 10*3/uL (ref 0.7–4.0)
MCH: 30.9 pg (ref 26.0–34.0)
MCHC: 32.7 g/dL (ref 30.0–36.0)
MCV: 94.5 fL (ref 80.0–100.0)
Monocytes Absolute: 0.4 10*3/uL (ref 0.1–1.0)
Monocytes Relative: 4 %
Neutro Abs: 8.6 10*3/uL — ABNORMAL HIGH (ref 1.7–7.7)
Neutrophils Relative %: 83 %
Platelets: 196 10*3/uL (ref 150–400)
RBC: 5.08 MIL/uL (ref 4.22–5.81)
RDW: 13 % (ref 11.5–15.5)
WBC: 10.5 10*3/uL (ref 4.0–10.5)
nRBC: 0 % (ref 0.0–0.2)

## 2024-04-02 LAB — COMPREHENSIVE METABOLIC PANEL WITH GFR
ALT: 56 U/L — ABNORMAL HIGH (ref 0–44)
AST: 49 U/L — ABNORMAL HIGH (ref 15–41)
Albumin: 4.4 g/dL (ref 3.5–5.0)
Alkaline Phosphatase: 72 U/L (ref 38–126)
Anion gap: 13 (ref 5–15)
BUN: 14 mg/dL (ref 8–23)
CO2: 18 mmol/L — ABNORMAL LOW (ref 22–32)
Calcium: 9.6 mg/dL (ref 8.9–10.3)
Chloride: 106 mmol/L (ref 98–111)
Creatinine, Ser: 1.43 mg/dL — ABNORMAL HIGH (ref 0.61–1.24)
GFR, Estimated: 54 mL/min — ABNORMAL LOW
Glucose, Bld: 108 mg/dL — ABNORMAL HIGH (ref 70–99)
Potassium: 4.3 mmol/L (ref 3.5–5.1)
Sodium: 137 mmol/L (ref 135–145)
Total Bilirubin: 0.8 mg/dL (ref 0.0–1.2)
Total Protein: 7.3 g/dL (ref 6.5–8.1)

## 2024-04-02 LAB — URINALYSIS, ROUTINE W REFLEX MICROSCOPIC
Bacteria, UA: NONE SEEN
Bilirubin Urine: NEGATIVE
Glucose, UA: NEGATIVE mg/dL
Hgb urine dipstick: NEGATIVE
Ketones, ur: NEGATIVE mg/dL
Leukocytes,Ua: NEGATIVE
Nitrite: NEGATIVE
Protein, ur: NEGATIVE mg/dL
Specific Gravity, Urine: 1.02 (ref 1.005–1.030)
pH: 6 (ref 5.0–8.0)

## 2024-04-02 LAB — LIPASE, BLOOD: Lipase: 32 U/L (ref 11–51)

## 2024-04-02 LAB — I-STAT CG4 LACTIC ACID, ED: Lactic Acid, Venous: 1.6 mmol/L (ref 0.5–1.9)

## 2024-04-02 MED ORDER — MORPHINE SULFATE (PF) 4 MG/ML IV SOLN
4.0000 mg | Freq: Once | INTRAVENOUS | Status: AC
  Administered 2024-04-02: 4 mg via INTRAVENOUS
  Filled 2024-04-02: qty 1

## 2024-04-02 MED ORDER — DIATRIZOATE MEGLUMINE & SODIUM 66-10 % PO SOLN
90.0000 mL | Freq: Once | ORAL | Status: AC
  Administered 2024-04-02: 90 mL via ORAL
  Filled 2024-04-02: qty 90

## 2024-04-02 MED ORDER — ACETAMINOPHEN 650 MG RE SUPP: 650.0000 mg | Freq: Four times a day (QID) | RECTAL | Status: DC | PRN

## 2024-04-02 MED ORDER — PANTOPRAZOLE SODIUM 40 MG IV SOLR
40.0000 mg | INTRAVENOUS | Status: AC
  Administered 2024-04-02 – 2024-04-05 (×4): 40 mg via INTRAVENOUS
  Filled 2024-04-02 (×4): qty 10

## 2024-04-02 MED ORDER — LACTATED RINGERS IV BOLUS
1000.0000 mL | Freq: Once | INTRAVENOUS | Status: AC
  Administered 2024-04-02: 1000 mL via INTRAVENOUS

## 2024-04-02 MED ORDER — ATORVASTATIN CALCIUM 10 MG PO TABS: 10.0000 mg | ORAL_TABLET | Freq: Every evening | ORAL | Status: DC

## 2024-04-02 MED ORDER — ONDANSETRON HCL 4 MG/2ML IJ SOLN
4.0000 mg | Freq: Once | INTRAMUSCULAR | Status: AC
  Administered 2024-04-02: 4 mg via INTRAVENOUS
  Filled 2024-04-02: qty 2

## 2024-04-02 MED ORDER — FENOFIBRATE 160 MG PO TABS: 160.0000 mg | ORAL_TABLET | Freq: Every evening | ORAL | Status: DC

## 2024-04-02 MED ORDER — INFLUENZA VAC SPLIT HIGH-DOSE 0.5 ML IM SUSY: 0.5000 mL | PREFILLED_SYRINGE | INTRAMUSCULAR | Status: AC | PRN

## 2024-04-02 MED ORDER — MORPHINE SULFATE (PF) 2 MG/ML IV SOLN
2.0000 mg | INTRAVENOUS | Status: DC | PRN
  Administered 2024-04-02 – 2024-04-03 (×6): 2 mg via INTRAVENOUS
  Filled 2024-04-02 (×6): qty 1

## 2024-04-02 MED ORDER — ACETAMINOPHEN 325 MG PO TABS: 650.0000 mg | ORAL_TABLET | Freq: Four times a day (QID) | ORAL | Status: DC | PRN

## 2024-04-02 MED ORDER — ONDANSETRON HCL 4 MG/2ML IJ SOLN
4.0000 mg | Freq: Four times a day (QID) | INTRAMUSCULAR | Status: AC | PRN
  Administered 2024-04-02 – 2024-04-05 (×7): 4 mg via INTRAVENOUS
  Filled 2024-04-02 (×7): qty 2

## 2024-04-02 MED ORDER — ENOXAPARIN SODIUM 40 MG/0.4ML IJ SOSY
40.0000 mg | PREFILLED_SYRINGE | INTRAMUSCULAR | Status: AC
  Administered 2024-04-02 – 2024-04-05 (×3): 40 mg via SUBCUTANEOUS
  Filled 2024-04-02 (×4): qty 0.4

## 2024-04-02 MED ORDER — ALBUTEROL SULFATE (2.5 MG/3ML) 0.083% IN NEBU: 2.5000 mg | INHALATION_SOLUTION | RESPIRATORY_TRACT | Status: AC | PRN

## 2024-04-02 MED ORDER — ONDANSETRON HCL 4 MG PO TABS: 4.0000 mg | ORAL_TABLET | Freq: Four times a day (QID) | ORAL | Status: AC | PRN

## 2024-04-02 MED ORDER — IOHEXOL 300 MG/ML  SOLN
100.0000 mL | Freq: Once | INTRAMUSCULAR | Status: AC | PRN
  Administered 2024-04-02: 100 mL via INTRAVENOUS

## 2024-04-02 MED ORDER — LACTATED RINGERS IV SOLN: INTRAVENOUS | Status: AC

## 2024-04-02 MED ORDER — TRAZODONE HCL 50 MG PO TABS: 25.0000 mg | ORAL_TABLET | Freq: Every evening | ORAL | Status: AC | PRN

## 2024-04-02 NOTE — Progress Notes (Signed)
 Patient completed gastrografin  at 2220. Xray tech notified.

## 2024-04-02 NOTE — H&P (Signed)
 " History and Physical  Cristino Degroff FMW:[id number] DOB: 05-07-1960 DOA: 04/02/2024  PCP: Arloa Elsie SAUNDERS, MD   Chief Complaint: Abdominal pain  HPI: Johnny Burns is a 64 y.o. male with medical history significant for CKD stage III, hyperlipidemia, prostate cancer with known metastatic disease and prior history of SBO being admitted to the hospital with 1 day of abdominal pain and nausea found to have partial SBO.  Patient states he was in his usual state of health until this morning, when he had sudden onset of crampy abdominal pain reminiscent of prior SBO.  There was some associated nausea, but no vomiting.  No chest pain, shortness of breath, fevers, chills.  He had a normal bowel yesterday, this morning he only had a small amount of flatus.  Review of Systems: Please see HPI for pertinent positives and negatives. A complete 10 system review of systems are otherwise negative.  Past Medical History:  Diagnosis Date   Chronic kidney disease    Stage 3 sees primary physcian   ED (erectile dysfunction)    GERD (gastroesophageal reflux disease)    History of kidney stones    Hyperlipidemia    Left ureteral calculus    Prostate cancer (HCC)    Stress incontinence    Past Surgical History:  Procedure Laterality Date   CHOLECYSTECTOMY N/A 04/04/2019   Procedure: LAPAROSCOPIC CHOLECYSTECTOMY;  Surgeon: Kinsinger, Herlene Righter, MD;  Location: [facility];  Service: General;  Laterality: N/A;   CYSTOSCOPY WITH RETROGRADE PYELOGRAM, URETEROSCOPY AND STENT PLACEMENT Left 11/30/2012   Procedure: CYSTOSCOPY WITH LEFT RETROGRADE PYELOGRAM, LEFT URETEROSCOPY LEFT STONE EXTRACTION WITH BASKET  AND LEFT STENT PLACEMENT;  Surgeon: Ricardo Likens, MD;  Location: [facility];  Service: Urology;  Laterality: Left;   CYSTOSCOPY WITH RETROGRADE PYELOGRAM, URETEROSCOPY AND STENT PLACEMENT Bilateral 04/15/2022   Procedure: CYSTOSCOPY WITH RETROGRADE PYELOGRAM,  URETEROSCOPY AND STENT PLACEMENT;  Surgeon: Likens Ricardo, MD;  Location: [facility];  Service: Urology;  Laterality: Bilateral;   EXTRACORPOREAL SHOCK WAVE LITHOTRIPSY Left 10/08/2012   HOLMIUM LASER APPLICATION Bilateral 04/15/2022   Procedure: HOLMIUM LASER APPLICATION;  Surgeon: Likens Ricardo, MD;  Location: [facility];  Service: Urology;  Laterality: Bilateral;   INGUINAL HERNIA REPAIR Right 1990   LYMPHADENECTOMY Bilateral 11/30/2016   Procedure: PELVIC LYMPHADENECTOMY;  Surgeon: Likens Ricardo, MD;  Location: [facility];  Service: Urology;  Laterality: Bilateral;   PROSTATE BIOPSY     ROBOT ASSISTED LAPAROSCOPIC RADICAL PROSTATECTOMY N/A 11/30/2016   Procedure: XI ROBOTIC ASSISTED LAPAROSCOPIC RADICAL PROSTATECTOMY;  Surgeon: Likens Ricardo, MD;  Location: [facility];  Service: Urology;  Laterality: N/A;   WISDOM TOOTH EXTRACTION     XI ROBOTIC ASSISTED SIMPLE PROSTATECTOMY     Dr. Likens 11/30/16   Social History:  reports that he has never smoked. He has never used smokeless tobacco. He reports that he does not drink alcohol and does not use drugs.  Allergies[1]  Family History  Problem Relation Age of Onset   Cancer Father        throat   Hypertension Brother      Prior to Admission medications  Medication Sig Start Date End Date Taking? Authorizing Provider  atorvastatin  (LIPITOR) 10 MG tablet Take 10 mg by mouth every evening.    [provider]  fenofibrate  160 MG tablet Take 160 mg by mouth every evening.    [provider]  Multiple Vitamins-Minerals (ICAPS AREDS 2 PO) Take by mouth daily.    [provider]  zinc gluconate 50 MG tablet Take 50 mg by mouth daily.    [provider]    Physical Exam: BP 121/81 (BP Location: Left Arm)   Pulse 70   Temp 98.3 F (36.8 C) (Oral)   Resp 18   Ht 5' 10 (1.778 m)   Wt 83.9 kg   SpO2 98%   BMI 26.54 kg/m  General:  Alert, oriented, calm, in no acute distress, looks overall comfortable, his  wife is at the bedside Eyes: EOMI, clear conjuctivae, white sclerea Neck: supple, no masses, trachea mildline  Cardiovascular: RRR, no murmurs or rubs, no peripheral edema  Respiratory: clear to auscultation bilaterally, no wheezes, no crackles  Abdomen: soft, mildly tender, distended, slow bowel tones heard  Skin: dry, no rashes  Musculoskeletal: no joint effusions, normal range of motion  Psychiatric: appropriate affect, normal speech  Neurologic: extraocular muscles intact, clear speech, moving all extremities with intact sensorium         Labs on Admission:  Basic Metabolic Panel: Recent Labs  Lab 04/02/24 1123  NA 137  K 4.3  CL 106  CO2 18*  GLUCOSE 108*  BUN 14  CREATININE 1.43*  CALCIUM  9.6   Liver Function Tests: Recent Labs  Lab 04/02/24 1123  AST 49*  ALT 56*  ALKPHOS 72  BILITOT 0.8  PROT 7.3  ALBUMIN 4.4   Recent Labs  Lab 04/02/24 1123  LIPASE 32   No results for input(s): AMMONIA in the last 168 hours. CBC: Recent Labs  Lab 04/02/24 1123  WBC 10.5  NEUTROABS 8.6*  HGB 15.7  HCT 48.0  MCV 94.5  PLT 196   Cardiac Enzymes: No results for input(s): CKTOTAL, CKMB, CKMBINDEX, TROPONINI in the last 168 hours. BNP (last 3 results) No results for input(s): BNP in the last 8760 hours.  ProBNP (last 3 results) No results for input(s): PROBNP in the last 8760 hours.  CBG: No results for input(s): GLUCAP in the last 168 hours.  Radiological Exams on Admission: CT ABDOMEN PELVIS W CONTRAST Result Date: 04/02/2024 CLINICAL DATA:  Upper abdominal pain. EXAM: CT ABDOMEN AND PELVIS WITH CONTRAST TECHNIQUE: Multidetector CT imaging of the abdomen and pelvis was performed using the standard protocol following bolus administration of intravenous contrast. RADIATION DOSE REDUCTION: This exam was performed according to the departmental dose-optimization program which includes automated exposure control, adjustment of the mA and/or kV according  to patient size and/or use of iterative reconstruction technique. CONTRAST:  OMNIPAQUE  IOHEXOL  300 MG/ML  SOLN COMPARISON:  March 31, 2022 FINDINGS: Lower chest: No acute abnormality. Hepatobiliary: There is diffuse fatty infiltration of the liver parenchyma. No focal liver abnormality is seen. Status post cholecystectomy. No biliary dilatation. Pancreas: Unremarkable. No pancreatic ductal dilatation or surrounding inflammatory changes. Spleen: Normal in size without focal abnormality. Adrenals/Urinary Tract: Adrenal glands are unremarkable. Kidneys are normal, without obstructing renal calculi or focal lesions. Bilateral extrarenal pelvis are noted. A 3 mm nonobstructing renal calculus is seen within the upper pole of the left kidney. A punctate nonobstructing renal calculus is also present within the lower pole of the right kidney. Mild bilateral hydronephrosis is noted. There is mild diffuse urinary bladder wall thickening. Stomach/Bowel: Stomach is within normal limits. Appendix appears normal. Fluid-filled bowel loops within the medial aspect of the mid to lower right abdomen measure approximately 2.7 cm in diameter. The remainder of the large and small bowel is decompressed and subsequently limited in evaluation. An abrupt transition zone between fluid-filled small bowel and  decompressed small bowel is seen within the right lower quadrant (axial CT images 59 through 63, CT series 2). Vascular/Lymphatic: Aortic atherosclerosis. No enlarged abdominal or pelvic lymph nodes. Reproductive: The prostate gland is surgically absent. Other: Small, stable fat containing umbilical hernia is seen. A stable intramuscular lipoma is noted along the anterolateral aspect of the mid left abdominal wall. No abdominopelvic ascites. Musculoskeletal: A sclerotic lesion is again seen within the T11 vertebral body. IMPRESSION: 1. Findings consistent with a partial distal small bowel obstruction. 2. Bilateral nonobstructing  renal calculi. 3. Mild bilateral hydronephrosis. 4. Mild diffuse urinary bladder wall thickening which may represent sequelae associated with cystitis. Correlation with urinalysis is recommended. 5. Evidence of prior prostatectomy. 6. Stable sclerotic lesion within the T11 vertebral body consistent with the patient's known metastatic lesion. 7. Aortic atherosclerosis. Electronically Signed   By: Suzen Dials M.D.   On: 04/02/2024 13:39   Assessment/Plan Hunner Garcon is a 64 y.o. male with medical history significant for CKD stage III, hyperlipidemia, prostate cancer with known metastatic disease and prior history of SBO being admitted to the hospital with 1 day of abdominal pain and nausea found to have partial SBO.   Partial/early SBO-likely due to prior history of abdominal-pelvic surgery.  Patient is currently comfortable without significant nausea. -Inpatient admission -Keep n.p.o. -IV fluids -Pain and nausea medication as needed -SBO protocol per general surgery, their management is greatly appreciated  CKD stage IIIa-renal function appears to be at baseline  Hyperlipidemia-fenofibrate  and Lipitor  DVT prophylaxis: Lovenox      Code Status: Full Code  Consults called: General Surgery  Admission status: The appropriate patient status for this patient is INPATIENT. Inpatient status is judged to be reasonable and necessary in order to provide the required intensity of service to ensure the patient's safety. The patient's presenting symptoms, physical exam findings, and initial radiographic and laboratory data in the context of their chronic comorbidities is felt to place them at high risk for further clinical deterioration. Furthermore, it is not anticipated that the patient will be medically stable for discharge from the hospital within 2 midnights of admission.    I certify that at the point of admission it is my clinical judgment that the patient will require inpatient hospital  care spanning beyond 2 midnights from the point of admission due to high intensity of service, high risk for further deterioration and high frequency of surveillance required  Time spent: 48 minutes  Leverne Tessler CHRISTELLA Gail MD [facility] Pager [phone]  If 7PM-7AM, please contact night-coverage [email or web address] Password [email or web address]  04/02/2024, 3:00 PM      [1] No Known Allergies  "

## 2024-04-03 ENCOUNTER — Inpatient Hospital Stay (HOSPITAL_COMMUNITY)

## 2024-04-03 LAB — CBC
HCT: 39.7 % (ref 39.0–52.0)
Hemoglobin: 13.5 g/dL (ref 13.0–17.0)
MCH: 31.3 pg (ref 26.0–34.0)
MCHC: 34 g/dL (ref 30.0–36.0)
MCV: 91.9 fL (ref 80.0–100.0)
Platelets: 179 10*3/uL (ref 150–400)
RBC: 4.32 MIL/uL (ref 4.22–5.81)
RDW: 12.9 % (ref 11.5–15.5)
WBC: 8.1 10*3/uL (ref 4.0–10.5)
nRBC: 0 % (ref 0.0–0.2)

## 2024-04-03 LAB — BASIC METABOLIC PANEL WITH GFR
Anion gap: 12 (ref 5–15)
BUN: 11 mg/dL (ref 8–23)
CO2: 21 mmol/L — ABNORMAL LOW (ref 22–32)
Calcium: 9.1 mg/dL (ref 8.9–10.3)
Chloride: 105 mmol/L (ref 98–111)
Creatinine, Ser: 1.25 mg/dL — ABNORMAL HIGH (ref 0.61–1.24)
GFR, Estimated: >60 mL/min
Glucose, Bld: 102 mg/dL — ABNORMAL HIGH (ref 70–99)
Potassium: 4.2 mmol/L (ref 3.5–5.1)
Sodium: 138 mmol/L (ref 135–145)

## 2024-04-03 LAB — HIV ANTIBODY (ROUTINE TESTING W REFLEX): HIV Screen 4th Generation wRfx: NONREACTIVE

## 2024-04-03 MED ORDER — LACTATED RINGERS IV SOLN: INTRAVENOUS | Status: AC

## 2024-04-03 MED ORDER — ACETAMINOPHEN 10 MG/ML IV SOLN
1000.0000 mg | Freq: Four times a day (QID) | INTRAVENOUS | Status: AC
  Administered 2024-04-03 – 2024-04-04 (×4): 1000 mg via INTRAVENOUS
  Filled 2024-04-03 (×4): qty 100

## 2024-04-03 MED ORDER — ALUM & MAG HYDROXIDE-SIMETH 200-200-20 MG/5ML PO SUSP
15.0000 mL | ORAL | Status: AC | PRN
  Administered 2024-04-03: 15 mL via ORAL
  Filled 2024-04-03: qty 30

## 2024-04-03 MED ORDER — MORPHINE SULFATE (PF) 4 MG/ML IV SOLN
4.0000 mg | INTRAVENOUS | Status: AC | PRN
  Administered 2024-04-03 – 2024-04-05 (×11): 4 mg via INTRAVENOUS
  Filled 2024-04-03 (×11): qty 1

## 2024-04-03 NOTE — TOC Initial Note (Signed)
 Transition of Care [facility]) - Initial/Assessment Note    Patient Details  Name: Johnny Burns MRN: [id number] Date of Birth: 1960/03/13  Transition of Care [facility]) CM/SW Contact:    Doneta Glenys DASEN, RN Phone Number: 04/03/2024, 4:26 PM  Clinical Narrative:                 PTA lives in house with spouse who will transport home at discharge. No IP CM needs identified during visit.  Expected Discharge Plan: Home/Self Care Barriers to Discharge: No Barriers Identified   Patient Goals and CMS Choice Patient states their goals for this hospitalization and ongoing recovery are:: home   Choice offered to / list presented to : NA      Expected Discharge Plan and Services In-house Referral: NA Discharge Planning Services: CM Consult   Living arrangements for the past 2 months: Single Family Home                 DME Arranged: N/A DME Agency: NA       HH Arranged: NA HH Agency: NA        Prior Living Arrangements/Services Living arrangements for the past 2 months: Single Family Home Lives with:: Spouse Patient language and need for interpreter reviewed:: Yes Do you feel safe going back to the place where you live?: Yes      Need for Family Participation in Patient Care: No (Comment) Care giver support system in place?: Yes (comment) Current home services:  (na) Criminal Activity/Legal Involvement Pertinent to Current Situation/Hospitalization: No - Comment as needed  Activities of Daily Living   ADL Screening (condition at time of admission) Independently performs ADLs?: Yes (appropriate for developmental age) Is the patient deaf or have difficulty hearing?: Yes Does the patient have difficulty seeing, even when wearing glasses/contacts?: No Does the patient have difficulty concentrating, remembering, or making decisions?: No  Permission Sought/Granted Permission sought to share information with : Case Manager Permission granted to share information with : Yes, Verbal  Permission Granted  Share Information with NAME: [name], [name], Emergency Contact  [phone]           Emotional Assessment Appearance:: Appears stated age Attitude/Demeanor/Rapport: Engaged Affect (typically observed): Appropriate Orientation: : Oriented to Self, Oriented to Place, Oriented to  Time, Oriented to Situation Alcohol / Substance Use: Not Applicable Psych Involvement: No (comment)  Admission diagnosis:  Small bowel obstruction (HCC) [K56.609] SBO (small bowel obstruction) (HCC) [K56.609] Patient Active Problem List   Diagnosis Date Noted   Malignant neoplasm of prostate metastatic to bone (HCC) 03/19/2023   Hyperlipidemia 09/01/2019   SBO (small bowel obstruction) (HCC) 05/03/2019   Prostate cancer (HCC) 11/30/2016   PCP:  Arloa Elsie SAUNDERS, MD Pharmacy:   [facility] [phone] - [place], [place] - [address] [place] [address] Phone: [phone] Fax: [phone]     Social Drivers of Health (SDOH) Social History: SDOH Screenings   Food Insecurity: No Food Insecurity (04/02/2024)  Housing: Low Risk (04/02/2024)  Transportation Needs: No Transportation Needs (04/02/2024)  Utilities: Not At Risk (04/02/2024)  Social Connections: Socially Integrated (04/02/2024)  Tobacco Use: Low Risk (04/02/2024)   SDOH Interventions:     Readmission Risk Interventions    04/03/2024    4:25 PM  Readmission Risk Prevention Plan  Post Dischage Appt Complete  Medication Screening Complete  Transportation Screening Complete

## 2024-04-03 NOTE — Plan of Care (Signed)

## 2024-04-03 NOTE — Progress Notes (Signed)
 " PROGRESS NOTE    Johnny Burns  FMW:[id number] DOB: [birth year] DOA: 04/02/2024 PCP: Arloa Elsie SAUNDERS, MD  Chief Complaint  Patient presents with   Abdominal Pain    Brief Narrative:   Johnny Burns is Johnny Burns 64 y.o. male with medical history significant for CKD stage III, hyperlipidemia, prostate cancer with known metastatic disease and prior history of SBO being admitted to the hospital with 1 day of abdominal pain and nausea found to have partial SBO.   Assessment & Plan:   Principal Problem:   SBO (small bowel obstruction) (HCC)  Partial/early SBO likely due to prior history of abdominal-pelvic surgery.   Abdominal plain films from this morning with contrast mostly in stomach, small volume in non dilated left abdominal small bowel  Appreciate surgery assistance - SBO protocol.  NPO.  May need NGT, diagnostic laparoscopy/ex lap if not improving.     CKD stage IIIa-renal function appears to be at baseline   Hyperlipidemia-fenofibrate  and Lipitor on hold with above  Hx Prostate Cancer Metastatic Disease Outpatient follow up    DVT prophylaxis: lovenox  Code Status: full Family Communication: wife Disposition:   Status is: Inpatient Remains inpatient appropriate because: need for continued inpatientcare   Consultants:  General surgery  Procedures:  none  Antimicrobials:  Anti-infectives (From admission, onward)    None       Subjective: No new complaints Wife at bedside  Objective: Vitals:   04/02/24 2208 04/03/24 0146 04/03/24 0646 04/03/24 1252  BP: 113/75 112/76 108/69 103/72  Pulse: 71 70 70 65  Resp: 16 16 16 19   Temp: 98.1 F (36.7 C) 97.8 F (36.6 C) 98.1 F (36.7 C) 98.6 F (37 C)  TempSrc: Oral Oral Oral Oral  SpO2: 95% 93% 92% 98%  Weight:      Height:       No intake or output data in the 24 hours ending 04/03/24 1711 Filed Weights   04/02/24 1107  Weight: 83.9 kg    Examination:  General exam: Appears calm and  comfortable  Respiratory system: unlabored Cardiovascular system: RRR Gastrointestinal system: distended, nontender, active bowel sounds Central nervous system: Alert and oriented. No focal neurological deficits. Extremities: no LEE    Data Reviewed: I have personally reviewed following labs and imaging studies  CBC: Recent Labs  Lab 04/02/24 1123 04/03/24 0435  WBC 10.5 8.1  NEUTROABS 8.6*  --   HGB 15.7 13.5  HCT 48.0 39.7  MCV 94.5 91.9  PLT 196 179    Basic Metabolic Panel: Recent Labs  Lab 04/02/24 1123 04/03/24 0435  NA 137 138  K 4.3 4.2  CL 106 105  CO2 18* 21*  GLUCOSE 108* 102*  BUN 14 11  CREATININE 1.43* 1.25*  CALCIUM  9.6 9.1    GFR: Estimated Creatinine Clearance: 60.8 mL/min (Eastin Swing) (by C-G formula based on SCr of 1.25 mg/dL (H)).  Liver Function Tests: Recent Labs  Lab 04/02/24 1123  AST 49*  ALT 56*  ALKPHOS 72  BILITOT 0.8  PROT 7.3  ALBUMIN 4.4    CBG: No results for input(s): GLUCAP in the last 168 hours.   No results found for this or any previous visit (from the past 240 hours).       Radiology Studies: DG Abd Portable 1V-Small Bowel Obstruction Protocol-initial, 8 hr delay Result Date: 04/03/2024 EXAM: 1 VIEW XRAY OF THE ABDOMEN 04/03/2024 06:00:00 AM COMPARISON: CT abdomen and pelvis 04/02/2024. CLINICAL HISTORY: 64 year old male. Small bowel obstruction suspected on CT abdomen and  pelvis yesterday. FINDINGS: Portable AP supine view(s) at 0557 hours. BOWEL: Most Oral contrast remains in the nondilated stomach. There is Nabor Thomann small volume of oral contrast visible in nondilated left abdominal small bowel loops. Paucity of small bowel gas similar to the CT yesterday. Gas containing right abdominal small bowel loops measuring up to 2.5 cm do not appear significantly changed. No large bowel oral contrast. SOFT TISSUES: Excreted IV contrast in the urinary bladder. No abnormal calcifications. BONES: No acute fracture. IMPRESSION: 1. Oral  contrast mostly remaining in the stomach. Small volume of oral contrast in non-dilated left abdominal small bowel. Gas pattern stable from CT yesterday. Electronically signed by: Helayne Hurst MD 04/03/2024 06:25 AM EST RP Workstation: [id number]   CT ABDOMEN PELVIS W CONTRAST Result Date: 04/02/2024 CLINICAL DATA:  Upper abdominal pain. EXAM: CT ABDOMEN AND PELVIS WITH CONTRAST TECHNIQUE: Multidetector CT imaging of the abdomen and pelvis was performed using the standard protocol following bolus administration of intravenous contrast. RADIATION DOSE REDUCTION: This exam was performed according to the departmental dose-optimization program which includes automated exposure control, adjustment of the mA and/or kV according to patient size and/or use of iterative reconstruction technique. CONTRAST:  OMNIPAQUE  IOHEXOL  300 MG/ML  SOLN COMPARISON:  March 31, 2022 FINDINGS: Lower chest: No acute abnormality. Hepatobiliary: There is diffuse fatty infiltration of the liver parenchyma. No focal liver abnormality is seen. Status post cholecystectomy. No biliary dilatation. Pancreas: Unremarkable. No pancreatic ductal dilatation or surrounding inflammatory changes. Spleen: Normal in size without focal abnormality. Adrenals/Urinary Tract: Adrenal glands are unremarkable. Kidneys are normal, without obstructing renal calculi or focal lesions. Bilateral extrarenal pelvis are noted. Mustafa Potts 3 mm nonobstructing renal calculus is seen within the upper pole of the left kidney. Jim Philemon punctate nonobstructing renal calculus is also present within the lower pole of the right kidney. Mild bilateral hydronephrosis is noted. There is mild diffuse urinary bladder wall thickening. Stomach/Bowel: Stomach is within normal limits. Appendix appears normal. Fluid-filled bowel loops within the medial aspect of the mid to lower right abdomen measure approximately 2.7 cm in diameter. The remainder of the large and small bowel is decompressed and  subsequently limited in evaluation. An abrupt transition zone between fluid-filled small bowel and decompressed small bowel is seen within the right lower quadrant (axial CT images 59 through 63, CT series 2). Vascular/Lymphatic: Aortic atherosclerosis. No enlarged abdominal or pelvic lymph nodes. Reproductive: The prostate gland is surgically absent. Other: Small, stable fat containing umbilical hernia is seen. Dorri Ozturk stable intramuscular lipoma is noted along the anterolateral aspect of the mid left abdominal wall. No abdominopelvic ascites. Musculoskeletal: Tanikka Bresnan sclerotic lesion is again seen within the T11 vertebral body. IMPRESSION: 1. Findings consistent with Dessirae Scarola partial distal small bowel obstruction. 2. Bilateral nonobstructing renal calculi. 3. Mild bilateral hydronephrosis. 4. Mild diffuse urinary bladder wall thickening which may represent sequelae associated with cystitis. Correlation with urinalysis is recommended. 5. Evidence of prior prostatectomy. 6. Stable sclerotic lesion within the T11 vertebral body consistent with the patient's known metastatic lesion. 7. Aortic atherosclerosis. Electronically Signed   By: Suzen Dials M.D.   On: 04/02/2024 13:39        Scheduled Meds:  atorvastatin   10 mg Oral QPM   enoxaparin  (LOVENOX ) injection  40 mg Subcutaneous Q24H   fenofibrate   160 mg Oral QPM   pantoprazole  (PROTONIX ) IV  40 mg Intravenous Q24H   Continuous Infusions:  acetaminophen  1,000 mg (04/03/24 1148)     LOS: 1 day    Time spent: over  30 min     Meliton Monte, MD [facility]   To contact the attending provider between 7A-7P or the covering provider during after hours 7P-7A, please log into the web site [email or web address] and access using universal [place] password for that web site. If you do not have the password, please call the hospital operator.  04/03/2024, 5:11 PM    "

## 2024-04-03 NOTE — Plan of Care (Signed)

## 2024-04-03 NOTE — Progress Notes (Signed)
 [facility] Progress Note     Subjective: CC:  Ongoing, cramping/tight abdominal pain, temporarily relieved by morphine . Mild nausea. No vomiting. No flatus or BM. States he feels the same as yesterday- no better, no worse. Wife and pastor at bedside.   Objective: Vital signs in last 24 hours: Temp:  [97.6 F (36.4 C)-98.3 F (36.8 C)] 98.1 F (36.7 C) (02/04 0646) Pulse Rate:  [66-84] 70 (02/04 0646) Resp:  [16-20] 16 (02/04 0646) BP: (108-127)/(69-84) 108/69 (02/04 0646) SpO2:  [92 %-100 %] 92 % (02/04 0646) Weight:  [83.9 kg] 83.9 kg (02/03 1107) Last BM Date : 04/01/24  Intake/Output from previous day: No intake/output data recorded. Intake/Output this shift: No intake/output data recorded.  PE: Gen:  Alert, NAD, pleasant Card:  Regular rate and rhythm, pedal pulses 2+ BL Pulm:  Normal effort, clear to auscultation bilaterally Abd: Soft, non-tender, mild distention, no gaurding.  Skin: warm and dry, no rashes  Psych: A&Ox3   Lab Results:  Recent Labs    04/02/24 1123 04/03/24 0435  WBC 10.5 8.1  HGB 15.7 13.5  HCT 48.0 39.7  PLT 196 179   BMET Recent Labs    04/02/24 1123 04/03/24 0435  NA 137 138  K 4.3 4.2  CL 106 105  CO2 18* 21*  GLUCOSE 108* 102*  BUN 14 11  CREATININE 1.43* 1.25*  CALCIUM  9.6 9.1   PT/INR No results for input(s): LABPROT, INR in the last 72 hours. CMP     Component Value Date/Time   NA 138 04/03/2024 0435   K 4.2 04/03/2024 0435   CL 105 04/03/2024 0435   CO2 21 (L) 04/03/2024 0435   GLUCOSE 102 (H) 04/03/2024 0435   BUN 11 04/03/2024 0435   CREATININE 1.25 (H) 04/03/2024 0435   CALCIUM  9.1 04/03/2024 0435   PROT 7.3 04/02/2024 1123   ALBUMIN 4.4 04/02/2024 1123   AST 49 (H) 04/02/2024 1123   ALT 56 (H) 04/02/2024 1123   ALKPHOS 72 04/02/2024 1123   BILITOT 0.8 04/02/2024 1123   GFRNONAA >60 04/03/2024 0435   GFRAA >60 09/03/2019 0529   Lipase     Component Value Date/Time   LIPASE 32  04/02/2024 1123       Studies/Results: DG Abd Portable 1V-Small Bowel Obstruction Protocol-initial, 8 hr delay Result Date: 04/03/2024 EXAM: 1 VIEW XRAY OF THE ABDOMEN 04/03/2024 06:00:00 AM COMPARISON: CT abdomen and pelvis 04/02/2024. CLINICAL HISTORY: 64 year old male. Small bowel obstruction suspected on CT abdomen and pelvis yesterday. FINDINGS: Portable AP supine view(s) at 0557 hours. BOWEL: Most Oral contrast remains in the nondilated stomach. There is a small volume of oral contrast visible in nondilated left abdominal small bowel loops. Paucity of small bowel gas similar to the CT yesterday. Gas containing right abdominal small bowel loops measuring up to 2.5 cm do not appear significantly changed. No large bowel oral contrast. SOFT TISSUES: Excreted IV contrast in the urinary bladder. No abnormal calcifications. BONES: No acute fracture. IMPRESSION: 1. Oral contrast mostly remaining in the stomach. Small volume of oral contrast in non-dilated left abdominal small bowel. Gas pattern stable from CT yesterday. Electronically signed by: Helayne Hurst MD 04/03/2024 06:25 AM EST RP Workstation: [id number]   CT ABDOMEN PELVIS W CONTRAST Result Date: 04/02/2024 CLINICAL DATA:  Upper abdominal pain. EXAM: CT ABDOMEN AND PELVIS WITH CONTRAST TECHNIQUE: Multidetector CT imaging of the abdomen and pelvis was performed using the standard protocol following bolus administration of intravenous contrast. RADIATION DOSE REDUCTION: This exam was  performed according to the departmental dose-optimization program which includes automated exposure control, adjustment of the mA and/or kV according to patient size and/or use of iterative reconstruction technique. CONTRAST:  OMNIPAQUE  IOHEXOL  300 MG/ML  SOLN COMPARISON:  March 31, 2022 FINDINGS: Lower chest: No acute abnormality. Hepatobiliary: There is diffuse fatty infiltration of the liver parenchyma. No focal liver abnormality is seen. Status post  cholecystectomy. No biliary dilatation. Pancreas: Unremarkable. No pancreatic ductal dilatation or surrounding inflammatory changes. Spleen: Normal in size without focal abnormality. Adrenals/Urinary Tract: Adrenal glands are unremarkable. Kidneys are normal, without obstructing renal calculi or focal lesions. Bilateral extrarenal pelvis are noted. A 3 mm nonobstructing renal calculus is seen within the upper pole of the left kidney. A punctate nonobstructing renal calculus is also present within the lower pole of the right kidney. Mild bilateral hydronephrosis is noted. There is mild diffuse urinary bladder wall thickening. Stomach/Bowel: Stomach is within normal limits. Appendix appears normal. Fluid-filled bowel loops within the medial aspect of the mid to lower right abdomen measure approximately 2.7 cm in diameter. The remainder of the large and small bowel is decompressed and subsequently limited in evaluation. An abrupt transition zone between fluid-filled small bowel and decompressed small bowel is seen within the right lower quadrant (axial CT images 59 through 63, CT series 2). Vascular/Lymphatic: Aortic atherosclerosis. No enlarged abdominal or pelvic lymph nodes. Reproductive: The prostate gland is surgically absent. Other: Small, stable fat containing umbilical hernia is seen. A stable intramuscular lipoma is noted along the anterolateral aspect of the mid left abdominal wall. No abdominopelvic ascites. Musculoskeletal: A sclerotic lesion is again seen within the T11 vertebral body. IMPRESSION: 1. Findings consistent with a partial distal small bowel obstruction. 2. Bilateral nonobstructing renal calculi. 3. Mild bilateral hydronephrosis. 4. Mild diffuse urinary bladder wall thickening which may represent sequelae associated with cystitis. Correlation with urinalysis is recommended. 5. Evidence of prior prostatectomy. 6. Stable sclerotic lesion within the T11 vertebral body consistent with the patient's  known metastatic lesion. 7. Aortic atherosclerosis. Electronically Signed   By: Suzen Dials M.D.   On: 04/02/2024 13:39    Anti-infectives: Anti-infectives (From admission, onward)    None        Assessment/Plan  pSBO, likely due to adhesions from previous surgery  -AFVSS, CBC WNL - s/p oral SBO protocol >> contrast remains in stomach/small bowel on KUB - continue NPO, repeat KUB at 1500. Failure to improve will warrant NGT placement and diagnostic laparoscopy/ex lap.   FEN - NPO, IV per primary team VTE - SCD's, Lovenox  ID - no abx from abdominal perspective; UA negative, no evidence of intra-abdominal infection Admit - TRH service     LOS: 1 day   I reviewed nursing notes, hospitalist notes, last 24 h vitals and pain scores, last 48 h intake and output, last 24 h labs and trends, and last 24 h imaging results.  This care required moderate level of medical decision making.   Almarie Pringle, PA-C [facility] Please see Amion for pager number during day hours 7:00am-4:30pm

## 2024-04-04 ENCOUNTER — Inpatient Hospital Stay (HOSPITAL_COMMUNITY)

## 2024-04-04 LAB — COMPREHENSIVE METABOLIC PANEL WITH GFR
ALT: 32 U/L (ref 0–44)
AST: 35 U/L (ref 15–41)
Albumin: 3.7 g/dL (ref 3.5–5.0)
Alkaline Phosphatase: 59 U/L (ref 38–126)
Anion gap: 13 (ref 5–15)
BUN: 16 mg/dL (ref 8–23)
CO2: 23 mmol/L (ref 22–32)
Calcium: 9.1 mg/dL (ref 8.9–10.3)
Chloride: 102 mmol/L (ref 98–111)
Creatinine, Ser: 1.35 mg/dL — ABNORMAL HIGH (ref 0.61–1.24)
GFR, Estimated: 58 mL/min — ABNORMAL LOW
Glucose, Bld: 94 mg/dL (ref 70–99)
Potassium: 4 mmol/L (ref 3.5–5.1)
Sodium: 138 mmol/L (ref 135–145)
Total Bilirubin: 0.8 mg/dL (ref 0.0–1.2)
Total Protein: 6.1 g/dL — ABNORMAL LOW (ref 6.5–8.1)

## 2024-04-04 LAB — CBC WITH DIFFERENTIAL/PLATELET
Abs Immature Granulocytes: 0.03 10*3/uL (ref 0.00–0.07)
Basophils Absolute: 0 10*3/uL (ref 0.0–0.1)
Basophils Relative: 0 %
Eosinophils Absolute: 0.1 10*3/uL (ref 0.0–0.5)
Eosinophils Relative: 1 %
HCT: 38.3 % — ABNORMAL LOW (ref 39.0–52.0)
Hemoglobin: 13.1 g/dL (ref 13.0–17.0)
Immature Granulocytes: 0 %
Lymphocytes Relative: 13 %
Lymphs Abs: 1.1 10*3/uL (ref 0.7–4.0)
MCH: 31.3 pg (ref 26.0–34.0)
MCHC: 34.2 g/dL (ref 30.0–36.0)
MCV: 91.6 fL (ref 80.0–100.0)
Monocytes Absolute: 0.5 10*3/uL (ref 0.1–1.0)
Monocytes Relative: 5 %
Neutro Abs: 7.1 10*3/uL (ref 1.7–7.7)
Neutrophils Relative %: 81 %
Platelets: 209 10*3/uL (ref 150–400)
RBC: 4.18 MIL/uL — ABNORMAL LOW (ref 4.22–5.81)
RDW: 12.8 % (ref 11.5–15.5)
WBC: 8.8 10*3/uL (ref 4.0–10.5)
nRBC: 0 % (ref 0.0–0.2)

## 2024-04-04 LAB — MAGNESIUM: Magnesium: 1.9 mg/dL (ref 1.7–2.4)

## 2024-04-04 LAB — PHOSPHORUS: Phosphorus: 3.3 mg/dL (ref 2.5–4.6)

## 2024-04-04 NOTE — Progress Notes (Signed)
 " PROGRESS NOTE    Johnny Burns  FMW:[id number] DOB: 1960/05/08 DOA: 04/02/2024 PCP: Arloa Elsie SAUNDERS, MD  Chief Complaint  Patient presents with   Abdominal Pain    Brief Narrative:   Johnny Burns is Johnny Burns 64 y.o. male with medical history significant for CKD stage III, hyperlipidemia, prostate cancer with known metastatic disease and prior history of SBO being admitted to the hospital with 1 day of abdominal pain and nausea found to have partial SBO.   Assessment & Plan:   Principal Problem:   SBO (small bowel obstruction) (HCC)  Partial/early SBO likely due to prior history of abdominal-pelvic surgery.   Plain films 2/5 with ileus or obstruction Appreciate surgery assistance - Continue NG tube.  Appreciate surgery recs/assistance.   CKD stage IIIa-renal function appears to be at baseline   Hyperlipidemia-fenofibrate  and Lipitor on hold with above  Hx Prostate Cancer Metastatic Disease Outpatient follow up    DVT prophylaxis: lovenox  Code Status: full Family Communication: wife Disposition:   Status is: Inpatient Remains inpatient appropriate because: need for continued inpatientcare   Consultants:  General surgery  Procedures:  none  Antimicrobials:  Anti-infectives (From admission, onward)    None       Subjective: No new complaints Wife at bedside  Objective: Vitals:   04/03/24 0646 04/03/24 1252 04/04/24 0656 04/04/24 0657  BP: 108/69 103/72 111/72 112/69  Pulse: 70 65 71 67  Resp: 16 19 18    Temp: 98.1 F (36.7 C) 98.6 F (37 C) 98 F (36.7 C)   TempSrc: Oral Oral Oral   SpO2: 92% 98% 96%   Weight:      Height:        Intake/Output Summary (Last 24 hours) at 04/04/2024 0957 Last data filed at 04/04/2024 0300 Gross per 24 hour  Intake 2192.69 ml  Output 1400 ml  Net 792.69 ml   Filed Weights   04/02/24 1107  Weight: 83.9 kg    Examination:  General: No acute distress. Lungs: unlabored Abdomen: less distension -  nontender - NG in place Neurological: Alert and oriented 3. Moves all extremities 4 with equal strength. Cranial nerves II through XII grossly intact. Extremities: No clubbing or cyanosis. No edema.  Data Reviewed: I have personally reviewed following labs and imaging studies  CBC: Recent Labs  Lab 04/02/24 1123 04/03/24 0435 04/04/24 0437  WBC 10.5 8.1 8.8  NEUTROABS 8.6*  --  7.1  HGB 15.7 13.5 13.1  HCT 48.0 39.7 38.3*  MCV 94.5 91.9 91.6  PLT 196 179 209    Basic Metabolic Panel: Recent Labs  Lab 04/02/24 1123 04/03/24 0435 04/04/24 0437  NA 137 138 138  K 4.3 4.2 4.0  CL 106 105 102  CO2 18* 21* 23  GLUCOSE 108* 102* 94  BUN 14 11 16   CREATININE 1.43* 1.25* 1.35*  CALCIUM  9.6 9.1 9.1  MG  --   --  1.9  PHOS  --   --  3.3    GFR: Estimated Creatinine Clearance: 56.3 mL/min (Yolande Skoda) (by C-G formula based on SCr of 1.35 mg/dL (H)).  Liver Function Tests: Recent Labs  Lab 04/02/24 1123 04/04/24 0437  AST 49* 35  ALT 56* 32  ALKPHOS 72 59  BILITOT 0.8 0.8  PROT 7.3 6.1*  ALBUMIN 4.4 3.7    CBG: No results for input(s): GLUCAP in the last 168 hours.   No results found for this or any previous visit (from the past 240 hours).  Radiology Studies: DG Abd Portable 1V Result Date: 04/04/2024 CLINICAL DATA:  Small bowel obstruction. EXAM: PORTABLE ABDOMEN - 1 VIEW COMPARISON:  04/04/2024, earlier same day FINDINGS: Since the film from 3 hours prior, no substantial change. Opacified small bowel loops in the abdomen and pelvis remain mildly distended. There is some gas visible in incompletely visualized right colon. IMPRESSION: No substantial change in the appearance of the abdomen since the film from 3 hours prior. Findings may be related to underlying component of ileus or obstruction. Electronically Signed   By: Camellia Candle M.D.   On: 04/04/2024 07:18   DG Abd 1 View Result Date: 04/04/2024 EXAM: 1 VIEW XRAY OF THE ABDOMEN 04/04/2024 02:18:00 AM  COMPARISON: 04/03/2024 CLINICAL HISTORY: Nasogastric tube present. FINDINGS: LINES, TUBES AND DEVICES: Gastric tube tip in mid stomach. BOWEL: Distended stomach. Dilute contrast throughout dilated small bowel loops, not significantly changed. SOFT TISSUES: No abnormal calcifications. BONES: No acute fracture. IMPRESSION: 1. Gastric tube tip in mid stomach. 2. Dilated small bowel loops with dilute contrast, not significantly changed. 3. Distended stomach. Electronically signed by: Greig Pique MD 04/04/2024 02:45 AM EST RP Workstation: [id number]   DG Abd Portable 1V Result Date: 04/03/2024 CLINICAL DATA:  Small bowel obstruction. EXAM: PORTABLE ABDOMEN - 1 VIEW COMPARISON:  CT yesterday. FINDINGS: Enteric contrast within the stomach. Enteric contrast within dilated small bowel. Proximal small bowel distension of 4.1 cm. There is no contrast in the colon. No evidence of free air. IMPRESSION: Enteric contrast within dilated small bowel. No contrast in the colon. Recommend 24 hour film after contrast administration. Electronically Signed   By: Andrea Gasman M.D.   On: 04/03/2024 19:11   DG Abd Portable 1V-Small Bowel Obstruction Protocol-initial, 8 hr delay Result Date: 04/03/2024 EXAM: 1 VIEW XRAY OF THE ABDOMEN 04/03/2024 06:00:00 AM COMPARISON: CT abdomen and pelvis 04/02/2024. CLINICAL HISTORY: 64 year old male. Small bowel obstruction suspected on CT abdomen and pelvis yesterday. FINDINGS: Portable AP supine view(s) at 0557 hours. BOWEL: Most Oral contrast remains in the nondilated stomach. There is Sharone Picchi small volume of oral contrast visible in nondilated left abdominal small bowel loops. Paucity of small bowel gas similar to the CT yesterday. Gas containing right abdominal small bowel loops measuring up to 2.5 cm do not appear significantly changed. No large bowel oral contrast. SOFT TISSUES: Excreted IV contrast in the urinary bladder. No abnormal calcifications. BONES: No acute fracture. IMPRESSION: 1. Oral  contrast mostly remaining in the stomach. Small volume of oral contrast in non-dilated left abdominal small bowel. Gas pattern stable from CT yesterday. Electronically signed by: Helayne Hurst MD 04/03/2024 06:25 AM EST RP Workstation: [id number]   CT ABDOMEN PELVIS W CONTRAST Result Date: 04/02/2024 CLINICAL DATA:  Upper abdominal pain. EXAM: CT ABDOMEN AND PELVIS WITH CONTRAST TECHNIQUE: Multidetector CT imaging of the abdomen and pelvis was performed using the standard protocol following bolus administration of intravenous contrast. RADIATION DOSE REDUCTION: This exam was performed according to the departmental dose-optimization program which includes automated exposure control, adjustment of the mA and/or kV according to patient size and/or use of iterative reconstruction technique. CONTRAST:  OMNIPAQUE  IOHEXOL  300 MG/ML  SOLN COMPARISON:  March 31, 2022 FINDINGS: Lower chest: No acute abnormality. Hepatobiliary: There is diffuse fatty infiltration of the liver parenchyma. No focal liver abnormality is seen. Status post cholecystectomy. No biliary dilatation. Pancreas: Unremarkable. No pancreatic ductal dilatation or surrounding inflammatory changes. Spleen: Normal in size without focal abnormality. Adrenals/Urinary Tract: Adrenal glands are unremarkable. Kidneys are normal, without obstructing  renal calculi or focal lesions. Bilateral extrarenal pelvis are noted. Broady Lafoy 3 mm nonobstructing renal calculus is seen within the upper pole of the left kidney. Nour Scalise punctate nonobstructing renal calculus is also present within the lower pole of the right kidney. Mild bilateral hydronephrosis is noted. There is mild diffuse urinary bladder wall thickening. Stomach/Bowel: Stomach is within normal limits. Appendix appears normal. Fluid-filled bowel loops within the medial aspect of the mid to lower right abdomen measure approximately 2.7 cm in diameter. The remainder of the large and small bowel is decompressed and  subsequently limited in evaluation. An abrupt transition zone between fluid-filled small bowel and decompressed small bowel is seen within the right lower quadrant (axial CT images 59 through 63, CT series 2). Vascular/Lymphatic: Aortic atherosclerosis. No enlarged abdominal or pelvic lymph nodes. Reproductive: The prostate gland is surgically absent. Other: Small, stable fat containing umbilical hernia is seen. Tessa Seaberry stable intramuscular lipoma is noted along the anterolateral aspect of the mid left abdominal wall. No abdominopelvic ascites. Musculoskeletal: Afifa Truax sclerotic lesion is again seen within the T11 vertebral body. IMPRESSION: 1. Findings consistent with Ibn Stief partial distal small bowel obstruction. 2. Bilateral nonobstructing renal calculi. 3. Mild bilateral hydronephrosis. 4. Mild diffuse urinary bladder wall thickening which may represent sequelae associated with cystitis. Correlation with urinalysis is recommended. 5. Evidence of prior prostatectomy. 6. Stable sclerotic lesion within the T11 vertebral body consistent with the patient's known metastatic lesion. 7. Aortic atherosclerosis. Electronically Signed   By: Suzen Dials M.D.   On: 04/02/2024 13:39        Scheduled Meds:  enoxaparin  (LOVENOX ) injection  40 mg Subcutaneous Q24H   pantoprazole  (PROTONIX ) IV  40 mg Intravenous Q24H   Continuous Infusions:  lactated ringers  Stopped (04/03/24 1812)     LOS: 2 days    Time spent: over 30 min     Meliton Monte, MD [facility]   To contact the attending provider between 7A-7P or the covering provider during after hours 7P-7A, please log into the web site [email or web address] and access using universal [place] password for that web site. If you do not have the password, please call the hospital operator.  04/04/2024, 9:57 AM    "

## 2024-04-04 NOTE — Plan of Care (Signed)

## 2024-04-04 NOTE — Progress Notes (Signed)
 Progress Note: General Surgery Service   Chief Complaint/Subjective: Abdomen feels less distended. No flatus or bowel movements.  Pain much improved since NG.   Objective: Vital signs in last 24 hours: Temp:  [98 F (36.7 C)-98.6 F (37 C)] 98 F (36.7 C) (02/05 0656) Pulse Rate:  [65-71] 67 (02/05 0657) Resp:  [18-19] 18 (02/05 0656) BP: (103-112)/(69-72) 112/69 (02/05 0657) SpO2:  [96 %-98 %] 96 % (02/05 0656) Last BM Date : 04/02/24 (prior to arrival)  Intake/Output from previous day: 02/04 0701 - 02/05 0700 In: 2192.7 [I.V.:1997.4; IV Piggyback:195.3] Out: 1400 [Emesis/NG output:1400] Intake/Output this shift: No intake/output data recorded.  Constitutional: NAD; conversant; no deformities GI: Abd Soft, nontender; NG w/ thin brown fluid draining  Lab Results: CBC  Recent Labs    04/03/24 0435 04/04/24 0437  WBC 8.1 8.8  HGB 13.5 13.1  HCT 39.7 38.3*  PLT 179 209   BMET Recent Labs    04/03/24 0435 04/04/24 0437  NA 138 138  K 4.2 4.0  CL 105 102  CO2 21* 23  GLUCOSE 102* 94  BUN 11 16  CREATININE 1.25* 1.35*  CALCIUM  9.1 9.1   PT/INR No results for input(s): LABPROT, INR in the last 72 hours. ABG No results for input(s): PHART, HCO3 in the last 72 hours.  Invalid input(s): PCO2, PO2  Anti-infectives: Anti-infectives (From admission, onward)    None       Medications: Scheduled Meds:  enoxaparin  (LOVENOX ) injection  40 mg Subcutaneous Q24H   pantoprazole  (PROTONIX ) IV  40 mg Intravenous Q24H   Continuous Infusions:  lactated ringers  Stopped (04/03/24 1812)   PRN Meds:.albuterol , alum & mag hydroxide-simeth, Influenza vac split trivalent PF, morphine  injection, ondansetron  **OR** ondansetron  (ZOFRAN ) IV, traZODone   Assessment/Plan: Johnny Burns is a 64 year old male with a bowel obstruction, likely adhesive.  Continue NG tube Contrast looks to be making some progress on x-ray and symptoms improving Okay for ice chips,  hard candy, gum   LOS: 2 days   Johnny JINNY Foy, MD  [facility] Use [email or web address] to contact on call provider  Daily Billing: [phone] - Moderate MDM

## 2024-04-04 NOTE — Progress Notes (Signed)
 Pt ambulated in hallway during shift. Reported 2 bowel movements. Surgery/Hospital team notified. Okay to remove NG tube and place pt on clear liquid diet. NG removed by this RN. Pt had scant amount of blood from nasal passage. Bleeding stopped. Denies pain and nausea. Pt NG removed and tolerated well.

## 2024-04-05 LAB — BASIC METABOLIC PANEL WITH GFR
Anion gap: 12 (ref 5–15)
BUN: 14 mg/dL (ref 8–23)
CO2: 24 mmol/L (ref 22–32)
Calcium: 9.1 mg/dL (ref 8.9–10.3)
Chloride: 102 mmol/L (ref 98–111)
Creatinine, Ser: 1.23 mg/dL (ref 0.61–1.24)
GFR, Estimated: >60 mL/min
Glucose, Bld: 98 mg/dL (ref 70–99)
Potassium: 3.7 mmol/L (ref 3.5–5.1)
Sodium: 138 mmol/L (ref 135–145)

## 2024-04-05 LAB — CBC
HCT: 39.7 % (ref 39.0–52.0)
Hemoglobin: 13.4 g/dL (ref 13.0–17.0)
MCH: 31.2 pg (ref 26.0–34.0)
MCHC: 33.8 g/dL (ref 30.0–36.0)
MCV: 92.3 fL (ref 80.0–100.0)
Platelets: 193 10*3/uL (ref 150–400)
RBC: 4.3 MIL/uL (ref 4.22–5.81)
RDW: 12.7 % (ref 11.5–15.5)
WBC: 9.4 10*3/uL (ref 4.0–10.5)
nRBC: 0 % (ref 0.0–0.2)

## 2024-04-05 LAB — PHOSPHORUS: Phosphorus: 1.8 mg/dL — ABNORMAL LOW (ref 2.5–4.6)

## 2024-04-05 LAB — MAGNESIUM: Magnesium: 2.1 mg/dL (ref 1.7–2.4)

## 2024-04-05 MED ORDER — LACTATED RINGERS IV SOLN: INTRAVENOUS | Status: AC

## 2024-04-05 MED ORDER — K PHOS MONO-SOD PHOS DI & MONO 155-852-130 MG PO TABS
500.0000 mg | ORAL_TABLET | Freq: Four times a day (QID) | ORAL | Status: AC
  Administered 2024-04-05 (×3): 500 mg via ORAL
  Filled 2024-04-05 (×4): qty 2

## 2024-04-05 MED ORDER — PROCHLORPERAZINE EDISYLATE 10 MG/2ML IJ SOLN
10.0000 mg | Freq: Four times a day (QID) | INTRAMUSCULAR | Status: AC | PRN
  Administered 2024-04-05: 10 mg via INTRAVENOUS
  Filled 2024-04-05: qty 2

## 2024-04-05 NOTE — Progress Notes (Signed)
 Progress Note: General Surgery Service   Chief Complaint/Subjective: Bowel movements last night NG removed, liquids going down okay, some worsening pain last night but better this AM  Objective: Vital signs in last 24 hours: Temp:  [97.8 F (36.6 C)-98.6 F (37 C)] 98.4 F (36.9 C) (02/06 0448) Pulse Rate:  [64-75] 64 (02/06 0448) Resp:  [14-16] 14 (02/06 0448) BP: (90-126)/(70-81) 111/70 (02/06 0448) SpO2:  [91 %-98 %] 91 % (02/06 0448) Last BM Date : 04/04/24 (per RN and pt)  Intake/Output from previous day: 02/05 0701 - 02/06 0700 In: -  Out: 700 [Emesis/NG output:700] Intake/Output this shift: No intake/output data recorded.  Constitutional: NAD; conversant; no deformities GI: Abd Mild distention, nontender  Lab Results: CBC  Recent Labs    04/04/24 0437 04/05/24 0442  WBC 8.8 9.4  HGB 13.1 13.4  HCT 38.3* 39.7  PLT 209 193   BMET Recent Labs    04/04/24 0437 04/05/24 0442  NA 138 138  K 4.0 3.7  CL 102 102  CO2 23 24  GLUCOSE 94 98  BUN 16 14  CREATININE 1.35* 1.23  CALCIUM  9.1 9.1   PT/INR No results for input(s): LABPROT, INR in the last 72 hours. ABG No results for input(s): PHART, HCO3 in the last 72 hours.  Invalid input(s): PCO2, PO2  Anti-infectives: Anti-infectives (From admission, onward)    None       Medications: Scheduled Meds:  enoxaparin  (LOVENOX ) injection  40 mg Subcutaneous Q24H   pantoprazole  (PROTONIX ) IV  40 mg Intravenous Q24H   phosphorus  500 mg Oral QID   Continuous Infusions:   PRN Meds:.albuterol , alum & mag hydroxide-simeth, Influenza vac split trivalent PF, morphine  injection, ondansetron  **OR** ondansetron  (ZOFRAN ) IV, traZODone   Assessment/Plan: Mr. Vahle is a 64 year old male with a bowel obstruction, likely adhesive.  Continue clear liquid diet today, had some abdominal pain after NG removal last night Having bowel movements   LOS: 3 days   Deward JINNY Foy, MD  [facility] Use [email or web address] to contact on call provider  Daily Billing: [phone] - Moderate MDM

## 2024-04-05 NOTE — Progress Notes (Signed)
 " PROGRESS NOTE    Johnny Burns  FMW:[id number] DOB: 1960/04/04 DOA: 04/02/2024 PCP: Arloa Elsie SAUNDERS, MD  Chief Complaint  Patient presents with   Abdominal Pain    Brief Narrative:   Johnny Burns is Johnny Burns 64 y.o. male with medical history significant for CKD stage III, hyperlipidemia, prostate cancer with known metastatic disease and prior history of SBO being admitted to the hospital with 1 day of abdominal pain and nausea found to have partial SBO.   Assessment & Plan:   Principal Problem:   SBO (small bowel obstruction) (HCC)  Partial/early SBO likely due to prior history of abdominal-pelvic surgery.   Plain films 2/5 with ileus or obstruction Appreciate surgery assistance - having BM's. Continue clears per surgery.  Appreciate surgery recs/assistance.   CKD stage IIIa-renal function appears to be at baseline   Hyperlipidemia-fenofibrate  and Lipitor on hold with above  Hx Prostate Cancer Metastatic Disease Outpatient follow up    DVT prophylaxis: lovenox  Code Status: full Family Communication: wife Disposition:   Status is: Inpatient Remains inpatient appropriate because: need for continued inpatientcare   Consultants:  General surgery  Procedures:  none  Antimicrobials:  Anti-infectives (From admission, onward)    None       Subjective: Crampy abdominal pain last night Better now Still passing stool Discomfort is less than Johnny Burns 5/10  Objective: Vitals:   04/04/24 0657 04/04/24 1321 04/04/24 1923 04/05/24 0448  BP: 112/69 90/81 126/74 111/70  Pulse: 67 70 75 64  Resp:  16 16 14   Temp:  98.6 F (37 C) 97.8 F (36.6 C) 98.4 F (36.9 C)  TempSrc:      SpO2:  91% 98% 91%  Weight:      Height:        Intake/Output Summary (Last 24 hours) at 04/05/2024 0958 Last data filed at 04/04/2024 1700 Gross per 24 hour  Intake --  Output 700 ml  Net -700 ml   Filed Weights   04/02/24 1107  Weight: 83.9 kg    Examination:  General: No acute  distress. Cardiovascular: RRR Lungs: unlabored Abdomen: mild distension (slightly worse than yesterday) - nontender Neurological: Alert and oriented 3. Moves all extremities 4 with equal strength. Cranial nerves II through XII grossly intact.  Data Reviewed: I have personally reviewed following labs and imaging studies  CBC: Recent Labs  Lab 04/02/24 1123 04/03/24 0435 04/04/24 0437 04/05/24 0442  WBC 10.5 8.1 8.8 9.4  NEUTROABS 8.6*  --  7.1  --   HGB 15.7 13.5 13.1 13.4  HCT 48.0 39.7 38.3* 39.7  MCV 94.5 91.9 91.6 92.3  PLT 196 179 209 193    Basic Metabolic Panel: Recent Labs  Lab 04/02/24 1123 04/03/24 0435 04/04/24 0437 04/05/24 0442  NA 137 138 138 138  K 4.3 4.2 4.0 3.7  CL 106 105 102 102  CO2 18* 21* 23 24  GLUCOSE 108* 102* 94 98  BUN 14 11 16 14   CREATININE 1.43* 1.25* 1.35* 1.23  CALCIUM  9.6 9.1 9.1 9.1  MG  --   --  1.9 2.1  PHOS  --   --  3.3 1.8*    GFR: Estimated Creatinine Clearance: 61.8 mL/min (by C-G formula based on SCr of 1.23 mg/dL).  Liver Function Tests: Recent Labs  Lab 04/02/24 1123 04/04/24 0437  AST 49* 35  ALT 56* 32  ALKPHOS 72 59  BILITOT 0.8 0.8  PROT 7.3 6.1*  ALBUMIN 4.4 3.7    CBG: No results for  input(s): GLUCAP in the last 168 hours.   No results found for this or any previous visit (from the past 240 hours).       Radiology Studies: DG Abd Portable 1V Result Date: 04/04/2024 CLINICAL DATA:  Small bowel obstruction. EXAM: PORTABLE ABDOMEN - 1 VIEW COMPARISON:  04/04/2024, earlier same day FINDINGS: Since the film from 3 hours prior, no substantial change. Opacified small bowel loops in the abdomen and pelvis remain mildly distended. There is some gas visible in incompletely visualized right colon. IMPRESSION: No substantial change in the appearance of the abdomen since the film from 3 hours prior. Findings may be related to underlying component of ileus or obstruction. Electronically Signed   By: Camellia Candle M.D.   On: 04/04/2024 07:18   DG Abd 1 View Result Date: 04/04/2024 EXAM: 1 VIEW XRAY OF THE ABDOMEN 04/04/2024 02:18:00 AM COMPARISON: 04/03/2024 CLINICAL HISTORY: Nasogastric tube present. FINDINGS: LINES, TUBES AND DEVICES: Gastric tube tip in mid stomach. BOWEL: Distended stomach. Dilute contrast throughout dilated small bowel loops, not significantly changed. SOFT TISSUES: No abnormal calcifications. BONES: No acute fracture. IMPRESSION: 1. Gastric tube tip in mid stomach. 2. Dilated small bowel loops with dilute contrast, not significantly changed. 3. Distended stomach. Electronically signed by: Greig Pique MD 04/04/2024 02:45 AM EST RP Workstation: [id number]   DG Abd Portable 1V Result Date: 04/03/2024 CLINICAL DATA:  Small bowel obstruction. EXAM: PORTABLE ABDOMEN - 1 VIEW COMPARISON:  CT yesterday. FINDINGS: Enteric contrast within the stomach. Enteric contrast within dilated small bowel. Proximal small bowel distension of 4.1 cm. There is no contrast in the colon. No evidence of free air. IMPRESSION: Enteric contrast within dilated small bowel. No contrast in the colon. Recommend 24 hour film after contrast administration. Electronically Signed   By: Andrea Gasman M.D.   On: 04/03/2024 19:11        Scheduled Meds:  enoxaparin  (LOVENOX ) injection  40 mg Subcutaneous Q24H   pantoprazole  (PROTONIX ) IV  40 mg Intravenous Q24H   phosphorus  500 mg Oral QID   Continuous Infusions:     LOS: 3 days    Time spent: over 30 min     Meliton Monte, MD [facility]   To contact the attending provider between 7A-7P or the covering provider during after hours 7P-7A, please log into the web site [email or web address] and access using universal [place] password for that web site. If you do not have the password, please call the hospital operator.  04/05/2024, 9:58 AM    "

## 2024-04-05 NOTE — Progress Notes (Signed)
" ° ° ° °  Patient Name: Johnny Burns           DOB: [birth year]  MRN: [id number]      Admission Date: 04/02/2024  Attending Provider: Perri DELENA Meliton Mickey., *  Primary Diagnosis: SBO (small bowel obstruction) (HCC)   Level of care: Med-Surg   OVERNIGHT EVENT  Patient is not tolerating oral fluids, becomes very nauseous and gags.  No emesis. I have added IV fluids and additional antiemetic to assist with nausea.    Milford Cilento, DNP, ACNPC- AG [facility] [place]    "

## 2024-04-05 NOTE — Plan of Care (Signed)

## 2024-04-16 ENCOUNTER — Other Ambulatory Visit (HOSPITAL_BASED_OUTPATIENT_CLINIC_OR_DEPARTMENT_OTHER)
